# Patient Record
Sex: Male | Born: 1950 | Race: White | Hispanic: No | Marital: Married | Smoking: Former smoker
Health system: Southern US, Community
[De-identification: ages and names within clinical notes are randomized; demographics above are authoritative.]

## PROBLEM LIST (undated history)

## (undated) DIAGNOSIS — E7800 Pure hypercholesterolemia, unspecified: Secondary | ICD-10-CM

## (undated) DIAGNOSIS — I10 Essential (primary) hypertension: Secondary | ICD-10-CM

---

## 2015-11-26 LAB — BASIC METABOLIC PANEL
BUN: 21 (ref 4–21)
Creatinine: 1.3 (ref <=1.3)
POTASSIUM: 4.6 (ref 3.4–5.3)
SODIUM: 134 — AB (ref 137–147)

## 2015-11-26 LAB — LIPID PANEL
Cholesterol: 279 — AB (ref 0–200)
HDL: 38 (ref 35–70)
LDL Cholesterol: 214
TRIGLYCERIDES: 135 (ref 40–160)

## 2015-11-26 LAB — VITAMIN B12: Vitamin B-12: 516

## 2015-11-26 LAB — HEMOGLOBIN A1C: HEMOGLOBIN A1C: 5.9

## 2015-11-26 LAB — CBC AND DIFFERENTIAL
HEMATOCRIT: 47 (ref 41–53)
HEMOGLOBIN: 15.8 (ref 13.5–17.5)
Neutrophils Absolute: 8211
PLATELETS: 365 (ref 150–399)
WBC: 11.9

## 2015-11-26 LAB — VITAMIN D 25 HYDROXY (VIT D DEFICIENCY, FRACTURES): Vit D, 25-Hydroxy: 30

## 2015-11-26 LAB — PSA: PSA: 1.3

## 2015-11-26 LAB — HEPATIC FUNCTION PANEL
ALT: 27 (ref 10–40)
AST: 21 (ref 14–40)
Alkaline Phosphatase: 82 (ref 25–125)
BILIRUBIN, TOTAL: 0.5

## 2015-11-26 LAB — TSH: TSH: 1.48 (ref <=5.90)

## 2016-09-05 ENCOUNTER — Observation Stay (HOSPITAL_COMMUNITY)
Admission: EM | Admit: 2016-09-05 | Discharge: 2016-09-07 | Disposition: A | Discharge status: Home / Self Care | Payer: Medicare Other | Attending: Internal Medicine | Admitting: Internal Medicine

## 2016-09-05 ENCOUNTER — Encounter (HOSPITAL_COMMUNITY): Payer: Self-pay

## 2016-09-05 DIAGNOSIS — Z79899 Other long term (current) drug therapy: Secondary | ICD-10-CM | POA: Diagnosis not present

## 2016-09-05 DIAGNOSIS — D72829 Elevated white blood cell count, unspecified: Secondary | ICD-10-CM | POA: Insufficient documentation

## 2016-09-05 DIAGNOSIS — R059 Cough, unspecified: Secondary | ICD-10-CM

## 2016-09-05 DIAGNOSIS — I10 Essential (primary) hypertension: Secondary | ICD-10-CM

## 2016-09-05 DIAGNOSIS — R05 Cough: Secondary | ICD-10-CM

## 2016-09-05 DIAGNOSIS — R55 Syncope and collapse: Principal | ICD-10-CM | POA: Diagnosis present

## 2016-09-05 DIAGNOSIS — E871 Hypo-osmolality and hyponatremia: Secondary | ICD-10-CM | POA: Insufficient documentation

## 2016-09-05 DIAGNOSIS — I700 Atherosclerosis of aorta: Secondary | ICD-10-CM | POA: Insufficient documentation

## 2016-09-05 DIAGNOSIS — F17210 Nicotine dependence, cigarettes, uncomplicated: Secondary | ICD-10-CM | POA: Insufficient documentation

## 2016-09-05 DIAGNOSIS — R42 Dizziness and giddiness: Secondary | ICD-10-CM | POA: Insufficient documentation

## 2016-09-05 DIAGNOSIS — E785 Hyperlipidemia, unspecified: Secondary | ICD-10-CM | POA: Insufficient documentation

## 2016-09-05 HISTORY — DX: Pure hypercholesterolemia, unspecified: E78.00

## 2016-09-05 HISTORY — DX: Essential (primary) hypertension: I10

## 2016-09-05 LAB — CBC
HCT: 41.4 % (ref 39.0–52.0)
HEMOGLOBIN: 14.1 g/dL (ref 13.0–17.0)
MCH: 29.2 pg (ref 26.0–34.0)
MCHC: 34.1 g/dL (ref 30.0–36.0)
MCV: 85.7 fL (ref 78.0–100.0)
PLATELETS: 409 10*3/uL — AB (ref 150–400)
RBC: 4.83 MIL/uL (ref 4.22–5.81)
RDW: 13.1 % (ref 11.5–15.5)
WBC: 14.9 10*3/uL — AB (ref 4.0–10.5)

## 2016-09-05 LAB — URINALYSIS, ROUTINE W REFLEX MICROSCOPIC
BILIRUBIN URINE: NEGATIVE
GLUCOSE, UA: 150 mg/dL — AB
Hgb urine dipstick: NEGATIVE
KETONES UR: NEGATIVE mg/dL
LEUKOCYTES UA: NEGATIVE
Nitrite: NEGATIVE
PROTEIN: NEGATIVE mg/dL
SPECIFIC GRAVITY, URINE: 1.004 — AB (ref 1.005–1.030)
pH: 5 (ref 5.0–8.0)

## 2016-09-05 LAB — BASIC METABOLIC PANEL WITH GFR
Anion gap: 10 (ref 5–15)
BUN: 14 mg/dL (ref 6–20)
CO2: 23 mmol/L (ref 22–32)
Calcium: 9.2 mg/dL (ref 8.9–10.3)
Chloride: 97 mmol/L — ABNORMAL LOW (ref 101–111)
Creatinine, Ser: 1.46 mg/dL — ABNORMAL HIGH (ref 0.61–1.24)
GFR calc Af Amer: 56 mL/min — ABNORMAL LOW
GFR calc non Af Amer: 48 mL/min — ABNORMAL LOW
Glucose, Bld: 175 mg/dL — ABNORMAL HIGH (ref 65–99)
Potassium: 4 mmol/L (ref 3.5–5.1)
Sodium: 130 mmol/L — ABNORMAL LOW (ref 135–145)

## 2016-09-05 LAB — I-STAT TROPONIN, ED: Troponin i, poc: 0 ng/mL (ref 0.00–0.08)

## 2016-09-05 LAB — OSMOLALITY: Osmolality: 288 mOsm/kg (ref 275–295)

## 2016-09-05 MED ORDER — CLONIDINE HCL 0.2 MG PO TABS
0.2000 mg | ORAL_TABLET | Freq: Every day | ORAL | Status: DC
  Administered 2016-09-06 – 2016-09-07 (×2): 0.2 mg via ORAL
  Filled 2016-09-05 (×2): qty 1

## 2016-09-05 MED ORDER — SENNOSIDES-DOCUSATE SODIUM 8.6-50 MG PO TABS: 1.0000 | ORAL_TABLET | Freq: Every evening | ORAL | Status: DC | PRN

## 2016-09-05 MED ORDER — ENOXAPARIN SODIUM 40 MG/0.4ML ~~LOC~~ SOLN
40.0000 mg | SUBCUTANEOUS | Status: DC
  Administered 2016-09-05 – 2016-09-06 (×2): 40 mg via SUBCUTANEOUS
  Filled 2016-09-05 (×2): qty 0.4

## 2016-09-05 MED ORDER — ATORVASTATIN CALCIUM 10 MG PO TABS
10.0000 mg | ORAL_TABLET | Freq: Every day | ORAL | Status: DC
  Administered 2016-09-06: 10 mg via ORAL
  Filled 2016-09-05: qty 1

## 2016-09-05 NOTE — ED Notes (Signed)
Report attempted 

## 2016-09-05 NOTE — ED Triage Notes (Signed)
Per Pt, Pt is coming from home with complaints of sudden onset of diaphoresis and generalized weakness. Pt reports having a similar episode a week ago in [employer] where a paramedic checked him and reported heat exhaustion. Pt denies CP, SOB, unilateral weakness, aphasia, or confusion. Reports one episode of vomiting and one episode of diarrhea today along with blurred vision in the last hour after the onset of the symptoms.

## 2016-09-05 NOTE — ED Provider Notes (Signed)
[facility]-EMERGENCY DEPT Provider Note   CSN: [id number] Arrival date & time: 09/05/16  1507 PCP Dr Clarene DukeLittle ([facility])    History   Chief Complaint Chief Complaint  Patient presents with  . Weakness    HPI Purcell MoutonMichael A Eddington Sr. is a 66 y.o. male.  HPI Pt was sitting down talking at the table. when he suddenly started feel weak.  He felt cold but not sweaty.  He felt clammy and his vision seemed blurred.  He thought he might pass out.  This lasted for about 5 minutes. He felt nervous and anxious after that.  He did vomit once as well.  He is feeling better right now although when he tried to stand up he felt weak.  The weakness was all over, not just one side or the other. A couple of weeks ago he had a similar episode while shopping.  EMS checked him out, he did not seek further evaluation.  They told him it was heat exhaustion and to drink a lot of water.  He has been drinking 3 bottles of water per day.The patient currently does not feel as bad as he did earlier.  Past Medical History:  Diagnosis Date  . High cholesterol   . Hypertension     There are no active problems to display for this patient.   History reviewed. No pertinent surgical history.     Home Medications    Prior to Admission medications   Not on File    Family History No family history on file.  Social History Social History  Substance Use Topics  . Smoking status: Current Every Day Smoker    Packs/day: 0.25    Types: Cigarettes  . Smokeless tobacco: Never Used  . Alcohol use No     Allergies   Patient has no known allergies.   Review of Systems Review of Systems  All other systems reviewed and are negative.    Physical Exam Updated Vital Signs BP 130/65   Pulse 60   Temp 97.9 F (36.6 C) (Oral)   Resp 11   Ht 1.803 m (5\' 11" )   Wt 92.1 kg (203 lb)   SpO2 98%   BMI 28.31 kg/m   Physical Exam  Constitutional: No distress.  HENT:  Head: Normocephalic and  atraumatic.  Right Ear: External ear normal.  Left Ear: External ear normal.  Eyes: Conjunctivae are normal. Right eye exhibits no discharge. Left eye exhibits no discharge. No scleral icterus.  Neck: Neck supple. No tracheal deviation present.  Cardiovascular: Normal rate, regular rhythm and intact distal pulses.   Pulmonary/Chest: Effort normal and breath sounds normal. No stridor. No respiratory distress. He has no wheezes. He has no rales.  Abdominal: Soft. Bowel sounds are normal. He exhibits no distension. There is no tenderness. There is no rebound and no guarding.  Musculoskeletal: He exhibits no edema or tenderness.  Neurological: He is alert. He has normal strength. No cranial nerve deficit (no facial droop, extraocular movements intact, no slurred speech) or sensory deficit. He exhibits normal muscle tone. He displays no seizure activity. Coordination normal.  Skin: Skin is warm and dry. No rash noted.  Psychiatric: He has a normal mood and affect.  Nursing note and vitals reviewed.    ED Treatments / Results  Labs (all labs ordered are listed, but only abnormal results are displayed) Labs Reviewed  BASIC METABOLIC PANEL - Abnormal; Notable for the following:       Result Value  Sodium 130 (*)    Chloride 97 (*)    Glucose, Bld 175 (*)    Creatinine, Ser 1.46 (*)    GFR calc non Af Amer 48 (*)    GFR calc Af Amer 56 (*)    All other components within normal limits  CBC - Abnormal; Notable for the following:    WBC 14.9 (*)    Platelets 409 (*)    All other components within normal limits  URINALYSIS, ROUTINE W REFLEX MICROSCOPIC - Abnormal; Notable for the following:    Color, Urine STRAW (*)    Specific Gravity, Urine 1.004 (*)    Glucose, UA 150 (*)    All other components within normal limits  I-STAT TROPONIN, ED    EKG  EKG Interpretation  Date/Time:  Saturday September 05 2016 18:16:07 EDT Ventricular Rate:  65 PR Interval:  132 QRS Duration: 96 QT  Interval:  412 QTC Calculation: 429 R Axis:   79 Text Interpretation:  Sinus rhythm Borderline low voltage, extremity leads No significant change since last tracing Confirmed by Linwood DibblesKnapp, Jozsef Wescoat [phone]([id number]) on 09/05/2016 6:28:22 PM       Radiology No results found.  Procedures Procedures (including critical care time)  Medications Ordered in ED Medications - No data to display   Initial Impression / Assessment and Plan / ED Course  I have reviewed the triage vital signs and the nursing notes.  Pertinent labs & imaging results that were available during my care of the patient were reviewed by me and considered in my medical decision making (see chart for details).   patient presented to the emergency room for evaluation of a near syncopal episode. Patient has had 2 events known the last couple of weeks. Initially was attributed to heat exhaustion. Today however the patient was at rest sitting at a table or a suddenly felt like he was going to pass out. Patient's neurologic exam is normal today. He was not complaining of focal neurologic deficits think that TIA or stroke are less likely. I am concerned about the possibility of a cardiac dysrhythmia. He does have history of hypertension and hypercholesterolemia. Considering his age and risk factors and think it's reasonable to bring him in for overnight observation.  Patient is also hyponatremic. I think this is likely related to his increased water intake. I did point out that the patient should try limiting his free water intake.  Final Clinical Impressions(s) / ED Diagnoses   Final diagnoses:  Near syncope  Hyponatremia      Linwood DibblesKnapp, Trent Theisen, MD 09/05/16 [phone]

## 2016-09-05 NOTE — H&P (Signed)
Date: 09/06/2016               Patient Name:  William Hogan. MRN: [id number]  DOB: 12/19/1950 Age / Sex: 66 y.o., male   PCP: Aida PufferLittle, James, MD         Medical Service: Internal Medicine Teaching Service         Attending Physician: Dr. Inez CatalinaMullen, Emily B, MD    First Contact: Dr. Crista ElliotHarbrecht Pager: [phone]  Second Contact: Dr. Lenis NoonVasu Pager: [phone]       After Hours (After 5p/  First Contact Pager: [phone]  weekends / holidays): Second Contact Pager: [phone]   Chief Complaint: dizziness, presyncope  History of Present Illness: Pt with PMH of HTN, HLD, reports traveling to go see his brother today.  While visiting, seated, talking with his brother he began to feel dizzy, his vision blurred and felt like he was going to pass out.  After the episode he felt weak and had difficulty standing up.  He denies weakness on one side of the body and describes it as a generalized weakness. He denies any associated chest pain, SOB, nausea, vomiting. Pt reports experiencing a similar episode about 2 weeks ago while going grocery shopping.  Pt said he just arrived at the grocery store and got a cart and was heading to the first aisle when he began feeling dizzy, his vision blurred and he felt weak.  The paramedics were called and he was evaluated and given the choice to go to the hospital for further evaluation.  He declined, at the time he was drinking only pepsi and has since switched to drinking three 16oz bottles of water per day.  He had another similar episode 5 years ago when his doctor added a new blood pressure medication lisinopril.  In the Emergency Department,  Pt had CBC, BMP, troponin, urinalysis and ECG that were negative other than a mild hyponatremia.    Meds:  No outpatient prescriptions have been marked as taking for the 09/05/16 encounter [facility](Hospital Encounter).   lisinopril 20mg  Atorvastatin 10mg  Bisoprolol 25mg  Clonidine 0.2mg    Allergies: Allergies as of 09/05/2016  . (No  Known Allergies)   Past Medical History:  Diagnosis Date  . High cholesterol   . Hypertension     Family History:   Social History:  3ppd smoking history for 43 years, down to a few cigarettes every few days per pt Prior alcohol use 2-3 beers per day quit in 2012  Review of Systems: Review of Systems  Constitutional: Negative for chills, diaphoresis, fever and weight loss.  HENT: Positive for hearing loss. Negative for ear discharge and ear pain.   Eyes: Positive for blurred vision. Negative for double vision and photophobia.  Respiratory: Negative for cough, hemoptysis and sputum production.   Cardiovascular: Negative for chest pain, palpitations, orthopnea, claudication, leg swelling and PND.  Gastrointestinal: Negative for heartburn, nausea and vomiting.  Genitourinary: Negative for dysuria and urgency.  Musculoskeletal: Negative for back pain, myalgias and neck pain.  Skin: Negative for itching and rash.  Neurological: Positive for dizziness and weakness. Negative for sensory change, speech change, focal weakness, seizures, loss of consciousness and headaches.  Psychiatric/Behavioral: Negative for depression and suicidal ideas.     Physical Exam: Blood pressure (!) 148/76, pulse 64, temperature 98.6 F (37 C), resp. rate 16, height 5\' 11"  (1.803 m), weight 198 lb 11.2 oz (90.1 kg), SpO2 100 %. Physical Exam  Constitutional: He is oriented to person, place, and  time. He appears well-developed and well-nourished.  HENT:  Head: Normocephalic and atraumatic.  Eyes: Pupils are equal, round, and reactive to light. EOM are normal. Right eye exhibits no discharge. Left eye exhibits no discharge. No scleral icterus.  Neck: Normal range of motion. Neck supple. No JVD present.  Cardiovascular: Normal rate, regular rhythm and intact distal pulses.   Heart sounds distant difficult to auscultate  Pulmonary/Chest: Effort normal and breath sounds normal. No respiratory distress. He has  no wheezes.  Abdominal: Soft. Bowel sounds are normal. He exhibits no distension. There is no tenderness. There is no guarding.  Neurological: He is alert and oriented to person, place, and time. He displays normal reflexes. No cranial nerve deficit or sensory deficit. He exhibits normal muscle tone. Coordination normal.  Skin: Skin is warm and dry. Capillary refill takes 2 to 3 seconds.  Psychiatric: He has a normal mood and affect. His behavior is normal.    EKG: personally reviewed my interpretation is sinus rhythm, normal axis, no st changes, normal intervals  CXR: personally reviewed my interpretation is   Assessment & Plan by Problem: Principal Problem:   Near syncope Active Problems:   Hyponatremia   Essential hypertension   Dyspnea on exertion  Near Syncope: episode occurred while pt at rest, similar episode two weeks ago in grocery store:  DDX:  Cardiac: either arrhythmia, primary mechanical dysfunction or 2/2 ischemia, pt has extensive smoking history, HTN, HLD Neurocardiogenic: pt has history of prior episodes that felt similar, pt has had a cough for the last month and had a coughing fit while we were present, pt likely has diabetes although claims to have been borderline at his last office visit Neurologic: pt likely has extensive vascular disease, could have resulted in TIA, vertebrobasil insufficiency  -Telemetry -orthostatic vital signs -ECHO -Repeat Troponin  Hyponatremia: unknown etiology -urine osmolality, urine sodium ordered   HTN: pt on clonidine and lisinopril at home -restarted home dose clonidine 0.2mg   Dyspnea on exertion: pt admits to shortness of breath when trying to mow the lawn, and will have to take frequent breaks, denies any chest pain -Monitor pt for signs of increasing SOB     Dispo: Admit patient to Observation with expected length of stay less than 2 midnights.  Signed: Angelita InglesWinfrey, Bryceton Hantz B, MD 09/06/2016, 12:52 AM  Pager: [phone]

## 2016-09-06 ENCOUNTER — Encounter (HOSPITAL_COMMUNITY): Payer: Self-pay | Admitting: *Deleted

## 2016-09-06 ENCOUNTER — Observation Stay (HOSPITAL_BASED_OUTPATIENT_CLINIC_OR_DEPARTMENT_OTHER): Payer: Medicare Other

## 2016-09-06 ENCOUNTER — Telehealth: Payer: Self-pay | Admitting: Internal Medicine

## 2016-09-06 ENCOUNTER — Observation Stay (HOSPITAL_COMMUNITY): Payer: Medicare Other

## 2016-09-06 DIAGNOSIS — R0609 Other forms of dyspnea: Secondary | ICD-10-CM | POA: Insufficient documentation

## 2016-09-06 DIAGNOSIS — I340 Nonrheumatic mitral (valve) insufficiency: Secondary | ICD-10-CM | POA: Diagnosis not present

## 2016-09-06 DIAGNOSIS — E871 Hypo-osmolality and hyponatremia: Secondary | ICD-10-CM | POA: Insufficient documentation

## 2016-09-06 DIAGNOSIS — I10 Essential (primary) hypertension: Secondary | ICD-10-CM

## 2016-09-06 DIAGNOSIS — R55 Syncope and collapse: Secondary | ICD-10-CM | POA: Diagnosis not present

## 2016-09-06 LAB — BASIC METABOLIC PANEL
Anion gap: 8 (ref 5–15)
BUN: 11 mg/dL (ref 6–20)
CALCIUM: 9.4 mg/dL (ref 8.9–10.3)
CO2: 26 mmol/L (ref 22–32)
CREATININE: 1.22 mg/dL (ref 0.61–1.24)
Chloride: 104 mmol/L (ref 101–111)
GFR calc non Af Amer: >60 mL/min (ref >60)
Glucose, Bld: 111 mg/dL — ABNORMAL HIGH (ref 65–99)
Potassium: 4.6 mmol/L (ref 3.5–5.1)
Sodium: 138 mmol/L (ref 135–145)

## 2016-09-06 LAB — CBC
HCT: 41.6 % (ref 39.0–52.0)
HEMOGLOBIN: 14 g/dL (ref 13.0–17.0)
MCH: 28.6 pg (ref 26.0–34.0)
MCHC: 33.7 g/dL (ref 30.0–36.0)
MCV: 85.1 fL (ref 78.0–100.0)
Platelets: 401 10*3/uL — ABNORMAL HIGH (ref 150–400)
RBC: 4.89 MIL/uL (ref 4.22–5.81)
RDW: 13.1 % (ref 11.5–15.5)
WBC: 14.2 10*3/uL — ABNORMAL HIGH (ref 4.0–10.5)

## 2016-09-06 LAB — CBC WITH DIFFERENTIAL/PLATELET
Basophils Absolute: 0 10*3/uL (ref 0.0–0.1)
Basophils Relative: 0 %
EOS PCT: 2 %
Eosinophils Absolute: 0.3 10*3/uL (ref 0.0–0.7)
HEMATOCRIT: 41.5 % (ref 39.0–52.0)
Hemoglobin: 14.1 g/dL (ref 13.0–17.0)
LYMPHS ABS: 2.7 10*3/uL (ref 0.7–4.0)
LYMPHS PCT: 21 %
MCH: 28.9 pg (ref 26.0–34.0)
MCHC: 34 g/dL (ref 30.0–36.0)
MCV: 85 fL (ref 78.0–100.0)
MONO ABS: 0.9 10*3/uL (ref 0.1–1.0)
MONOS PCT: 7 %
Neutro Abs: 9 10*3/uL — ABNORMAL HIGH (ref 1.7–7.7)
Neutrophils Relative %: 70 %
Platelets: 396 10*3/uL (ref 150–400)
RBC: 4.88 MIL/uL (ref 4.22–5.81)
RDW: 13.1 % (ref 11.5–15.5)
WBC: 12.9 10*3/uL — ABNORMAL HIGH (ref 4.0–10.5)

## 2016-09-06 LAB — ECHOCARDIOGRAM COMPLETE
HEIGHTINCHES: 71 in
WEIGHTICAEL: 3179.2 [oz_av]

## 2016-09-06 LAB — SODIUM, URINE, RANDOM: Sodium, Ur: 40 mmol/L

## 2016-09-06 LAB — OSMOLALITY, URINE: Osmolality, Ur: 162 mOsm/kg — ABNORMAL LOW (ref 300–900)

## 2016-09-06 MED ORDER — BISOPROLOL FUMARATE 10 MG PO TABS
10.0000 mg | ORAL_TABLET | Freq: Every day | ORAL | Status: DC
  Administered 2016-09-06 – 2016-09-07 (×2): 10 mg via ORAL
  Filled 2016-09-06 (×2): qty 1

## 2016-09-06 MED ORDER — LISINOPRIL 20 MG PO TABS
20.0000 mg | ORAL_TABLET | Freq: Every day | ORAL | Status: DC
  Filled 2016-09-06: qty 1

## 2016-09-06 MED ORDER — SODIUM CHLORIDE 0.9 % IV SOLN
INTRAVENOUS | Status: DC
  Administered 2016-09-06: 02:00:00 via INTRAVENOUS

## 2016-09-06 MED ORDER — LISINOPRIL 20 MG PO TABS
20.0000 mg | ORAL_TABLET | Freq: Every day | ORAL | Status: DC
  Administered 2016-09-06: 20 mg via ORAL

## 2016-09-06 NOTE — Progress Notes (Signed)
  2D Echocardiogram has been performed.  William Hogan T Damonica Chopra 09/06/2016, 2:03 PM

## 2016-09-06 NOTE — Progress Notes (Signed)
History              Chief Complaint    Chief Complaint  Patient presents with  . Weakness    HPI Purcell MoutonMichael A Encinas Sr. is a 66 y.o. male.  HPI Pt was sitting down talking at the table. when he suddenly started feel weak.  He felt cold but not sweaty.  He felt clammy and his vision seemed blurred.  He thought he might pass out.  This lasted for about 5 minutes. He felt nervous and anxious after that.  He did vomit once as well.  He is feeling better right now although when he tried to stand up he felt weak.  The weakness was all over, not just one side or the other. A couple of weeks ago he had a similar episode while shopping.  EMS checked him out, he did not seek further evaluation.  They told him it was heat exhaustion and to drink a lot of water.  He has been drinking 3 bottles of water per day.The patient currently does not feel as bad as he did earlier.      Past Medical History:  Diagnosis Date  . High cholesterol   . Hypertension     There are no active problems to display for this patient.   History reviewed. No pertinent surgical history. Patient arrived via stretcher from the ED, alert and oriented x 4.  IV in LAC, saline locked.  Placed on telemetry box 6 and continuous pulse ox.  Skin intact except for abrasion on right lower leg (old) and a scab on the right great toe, ota.  Oriented to floor and given call bell with instructions.  Will continue to monitor.  Macarthur CritchleyMarie Jalesia Loudenslager, RN

## 2016-09-06 NOTE — Progress Notes (Addendum)
   Subjective: [name] was sitting up in his bed talking to his brother upon entering the room today. He stated that he felt well, had not experienced another events since the day prior and was feeling good overall. He denied concerns but questioned the cause of the events. He added that he had issues with taking his medications all at the same time in the morning prior to events he described as being in a fog and unable to think or recall where he was after arriving at work. His doctor told him to take all of them in the evening late for prevent this which he had been doing. His most recent episode occurred while sitting talking to his brother while the former episode occurred while he was walking through a grocery store. He was advised that we would continue to evaluate him but that his most likely source was secondary to mediation induced bradycardia. He denied nausea, vomiting, diarrhea, constipation, abdominal pain chest pain, muscle aches, urinary frequency, visual changes (except with the episodes), or dizziness. He denied loss of bowel or bladder control, tremor or shaking as well as loss of consciousness during the episodes. Of note, his BP was taken at returned within normal range after each episode where he is typically hypertensive.   Objective:  Vital signs in last 24 hours: Vitals:   09/05/16 2127 09/06/16 0443 09/06/16 0929 09/06/16 0929  BP: (!) 148/76 (!) 124/98 (!) 167/53 (!) 167/59  Pulse: 64 (!) 52  (!) 59  Resp: 16 20    Temp: 98.6 F (37 C) 97.8 F (36.6 C)  98.1 F (36.7 C)  TempSrc:    Oral  SpO2: 100% 96%  98%  Weight: 198 lb 11.2 oz (90.1 kg)     Height: 5\' 11"  (1.803 m)      Complete ROS negative except as per subjective.  Physical Exam  Constitutional: He appears well-developed and well-nourished. No distress.  HENT:  Head: Normocephalic and atraumatic.  Neck: Normal range of motion. Neck supple. No JVD present.  Cardiovascular: Normal rate and regular  rhythm.   No murmur heard. Pulmonary/Chest: Effort normal and breath sounds normal. No stridor. No respiratory distress. He has no wheezes.  Abdominal: Soft. Bowel sounds are normal. He exhibits no distension. There is no tenderness.  Musculoskeletal: Normal range of motion. He exhibits no edema or tenderness.  Neurological: He is alert.  Skin: Skin is warm and dry. He is not diaphoretic.  Psychiatric: He has a normal mood and affect. Judgment and thought content normal.    Assessment/Plan:  Principal Problem:   Near syncope Active Problems:   Hyponatremia   Essential hypertension   Dyspnea on exertion  Near syncope, dyspnea on exertion: Negative orthostatics, tele unremarkable for arrhythmia, showing persistent bradycardia -Held bisoprolol initially, will continue at half dose to decrease rate control medications due to bradycardia -Continued it as half his home dose, now 10mg  daily -continued clonidine 0.2mg  daily -lisinopril 40mg  continued -Echo ordered with results pending  Hyponatremia:  resolved  HTN: -Continued it as half his home dose, now 10mg  daily -continued clonidine 0.2mg  daily -lisinopril 40mg  continued  Diet: Regular Code: Full Fluids: none DVT ppx: lovenox 40mg  daily Dispo: Anticipated discharge in approximately 1-2 day(s).   Lanelle BalHarbrecht, Breydan [place]illing[place], MD 09/06/2016, 2:58 PM Pager: Pager# [phone]

## 2016-09-06 NOTE — Evaluation (Signed)
Physical Therapy Evaluation Patient Details Name: William MoutonMichael A Duggan Sr. MRN: [id number] DOB: 1950/12/03 Today's Date: 09/06/2016   History of Present Illness  Pt is a 66 yo male admitted through ED on 09/05/16 following an episode of dizziness, blurred vision and syncopal symptoms. Pt had a similar episode x 2 weeks. Diagnosis still pending. Questioning arrythmia vs cardiac ischemia. Pt was found to have hyponatremia and DOE. PMH significant for HTN.   Clinical Impression  Pt presents with the above diagnosis and below deficits for therapy evaluation. Prior to admission, pt was living with his wife for whom he is the primary caregiver. Pt's wife had a stroke in 2013 and he does all household chores and errands. Pt was completely independent prior to admission. Pt requires Min guard to supervision for all mobility this session with a right drift during gait having to grab on the rail to steady himself. Recommended the possibility of using a [place] for stability once discharged. PT will return next session to re-check mobility with and without [place] prior to making final recommendation. Pt will benefit from continued acute PT services in order to address the below deficits prior to D/C.    Follow Up Recommendations No PT follow up    Equipment Recommendations  Cane    Recommendations for Other Services       Precautions / Restrictions Precautions Precautions: Fall Restrictions Weight Bearing Restrictions: No      Mobility  Bed Mobility Overal bed mobility: Modified Independent             General bed mobility comments: able to get into and out of bed without any assistance just increased time  Transfers Overall transfer level: Needs assistance Equipment used: None Transfers: Sit to/from Stand Sit to Stand: Supervision         General transfer comment: supervision for safety, no physical assistance  Ambulation/Gait Ambulation/Gait assistance: Supervision;Min guard Ambulation  Distance (Feet): 120 Feet Assistive device: None Gait Pattern/deviations: Step-through pattern;Decreased stride length;Drifts right/left Gait velocity: decreased Gait velocity interpretation: Below normal speed for age/gender General Gait Details: Minimal drifting to right and grasping for railing occasionally. Increased fatigue noted and unsteadiness with gait.   Stairs            Wheelchair Mobility    Modified Rankin (Stroke Patients Only)       Balance Overall balance assessment: Needs assistance Sitting-balance support: No upper extremity supported;Feet supported Sitting balance-Leahy Scale: Normal     Standing balance support: No upper extremity supported;During functional activity Standing balance-Leahy Scale: Fair Standing balance comment: able to stand statically and dynamically, but some unsteadiness with self recovery                             Pertinent Vitals/Pain Pain Assessment: No/denies pain    Home Living Family/patient expects to be discharged to:: Private residence Living Arrangements: Spouse/significant other Available Help at Discharge: Family;Available PRN/intermittently;Other (Comment) (pt is caregiver for wife) Type of Home: Mobile home Home Access: Stairs to enter Entrance Stairs-Rails: Right;Left;Can reach both Entrance Stairs-Number of Steps: 3 Home Layout: One level Home Equipment: None      Prior Function Level of Independence: Independent         Comments: completely independent prior to admission, driving and doing for himself.      Hand Dominance   Dominant Hand: Right    Extremity/Trunk Assessment   Upper Extremity Assessment Upper Extremity Assessment: Overall WFL for tasks  assessed    Lower Extremity Assessment Lower Extremity Assessment: Overall WFL for tasks assessed    Cervical / Trunk Assessment Cervical / Trunk Assessment: Normal  Communication   Communication: No difficulties  Cognition  Arousal/Alertness: Awake/alert Behavior During Therapy: WFL for tasks assessed/performed Overall Cognitive Status: Within Functional Limits for tasks assessed                                        General Comments General comments (skin integrity, edema, etc.): 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 Pt quit work to care for her. Reports that he goes to the grocery store, does all meal prep, and assist with caring for his wife.     Exercises     Assessment/Plan    PT Assessment Patient needs continued PT services  PT Problem List Decreased activity tolerance;Decreased balance;Decreased mobility;Decreased knowledge of use of DME       PT Treatment Interventions DME instruction;Gait training;Stair training;Functional mobility training;Therapeutic activities;Therapeutic exercise;Balance training    PT Goals (Current goals can be found in the Care Plan section)  Acute Rehab PT Goals Patient Stated Goal: to feel better and get back home PT Goal Formulation: With patient Time For Goal Achievement: 09/13/16 Potential to Achieve Goals: Good    Frequency Min 3X/week   Barriers to discharge        Co-evaluation               AM-PAC PT "6 Clicks" Daily Activity  Outcome Measure Difficulty turning over in bed (including adjusting bedclothes, sheets and blankets)?: None Difficulty moving from lying on back to sitting on the side of the bed? : None Difficulty sitting down on and standing up from a chair with arms (e.g., wheelchair, bedside commode, etc,.)?: A Little Help needed moving to and from a bed to chair (including a wheelchair)?: A Little Help needed walking in hospital room?: A Little Help needed climbing 3-5 steps with a railing? : A Little 6 Click Score: 20    End of Session Equipment Utilized During Treatment: Gait belt Activity Tolerance: Patient tolerated treatment well Patient left: in bed;with call bell/phone within  reach Nurse Communication: Mobility status PT Visit Diagnosis: Unsteadiness on feet (R26.81);Difficulty in walking, not elsewhere classified (R26.2)    Time: [id number] PT Time Calculation (min) (ACUTE ONLY): 16 min   Charges:   PT Evaluation $PT Eval Low Complexity: 1 Procedure     PT G Codes:   PT G-Codes **NOT FOR INPATIENT CLASS** Functional Assessment Tool Used: AM-PAC 6 Clicks Basic Mobility;Clinical judgement Functional Limitation: Mobility: Walking and moving around Mobility: Walking and Moving Around Current Status ([id number]([id number]): At least 20 percent but less than 40 percent impaired, limited or restricted Mobility: Walking and Moving Around Goal Status [phone]([id number]): 0 percent impaired, limited or restricted    Colin BroachSabra M. Kassidee Narciso PT, DPT  [phone]   William Hogan 09/06/2016, 3:23 PM

## 2016-09-06 NOTE — Progress Notes (Signed)
  2D Echocardiogram has been performed.  William Hogan T Ladine Kiper 09/06/2016, 2:02 PM

## 2016-09-07 DIAGNOSIS — R911 Solitary pulmonary nodule: Secondary | ICD-10-CM | POA: Diagnosis not present

## 2016-09-07 DIAGNOSIS — R55 Syncope and collapse: Secondary | ICD-10-CM

## 2016-09-07 DIAGNOSIS — D72829 Elevated white blood cell count, unspecified: Secondary | ICD-10-CM

## 2016-09-07 DIAGNOSIS — Z79899 Other long term (current) drug therapy: Secondary | ICD-10-CM

## 2016-09-07 DIAGNOSIS — I10 Essential (primary) hypertension: Secondary | ICD-10-CM | POA: Diagnosis not present

## 2016-09-07 LAB — CBC
HEMATOCRIT: 40.5 % (ref 39.0–52.0)
Hemoglobin: 13.7 g/dL (ref 13.0–17.0)
MCH: 28.8 pg (ref 26.0–34.0)
MCHC: 33.8 g/dL (ref 30.0–36.0)
MCV: 85.1 fL (ref 78.0–100.0)
PLATELETS: 389 10*3/uL (ref 150–400)
RBC: 4.76 MIL/uL (ref 4.22–5.81)
RDW: 13.1 % (ref 11.5–15.5)
WBC: 13.3 10*3/uL — ABNORMAL HIGH (ref 4.0–10.5)

## 2016-09-07 MED ORDER — HYDROCHLOROTHIAZIDE 12.5 MG PO CAPS: 12.5000 mg | ORAL_CAPSULE | Freq: Every day | ORAL | 2 refills | Status: DC

## 2016-09-07 MED ORDER — LISINOPRIL 40 MG PO TABS: 40.0000 mg | ORAL_TABLET | Freq: Every day | ORAL | 1 refills | Status: DC

## 2016-09-07 MED ORDER — LISINOPRIL 40 MG PO TABS: 40.0000 mg | ORAL_TABLET | Freq: Every day | ORAL | Status: DC

## 2016-09-07 MED ORDER — LISINOPRIL 20 MG PO TABS: 20.0000 mg | ORAL_TABLET | Freq: Every day | ORAL | Status: DC

## 2016-09-07 MED ORDER — HYDROCHLOROTHIAZIDE 12.5 MG PO CAPS: 12.5000 mg | ORAL_CAPSULE | Freq: Every day | ORAL | Status: DC

## 2016-09-07 MED ORDER — LISINOPRIL 20 MG PO TABS: 20.0000 mg | ORAL_TABLET | Freq: Every day | ORAL | 1 refills | Status: DC

## 2016-09-07 MED ORDER — HYDROCHLOROTHIAZIDE 10 MG/ML ORAL SUSPENSION
6.2500 mg | Freq: Every day | ORAL | Status: DC
  Filled 2016-09-07: qty 1.25

## 2016-09-07 NOTE — Discharge Instructions (Signed)
Please call and schedule an appointment for a hospital follow-up with your primary care doctor(PCP). At that visit, please discuss your episodes of pre-syncope and the medication changes that we introduced here at the hospital. It is our recommendation that you begin taking a low dose of a beta-blocker that has been shown to benefit individuals with heart failure if tolerated by your heart rate. The goal is to keep your heart rate near 60, which was without the bisoprolol.   The echocardiogram performed in the hospital indicated a slightly reduced ejection fraction of 50-55%, something that you should discuss with you PCP as well but not something to be alarmed about.   In addition, we have increased your lisinopril to 40mg  daily which should provide better blood pressure control without decreasing your heart rate. Your heart rate was in the upper 40 to lower 50's during your stay.

## 2016-09-07 NOTE — Care Management Obs Status (Signed)
MEDICARE OBSERVATION STATUS NOTIFICATION   Patient Details  Name: Purcell MoutonMichael A Jividen Sr. MRN: [id number] Date of Birth: 1950/08/06   Medicare Observation Status Notification Given:  Yes    Epifanio LeschesCole, Eduard Penkala Hudson, RN 09/07/2016, 10:57 AM

## 2016-09-07 NOTE — Progress Notes (Signed)
Internal Medicine Attending:   I saw and examined the patient. I reviewed the resident's note and I agree with the resident's findings and plan as documented in the resident's note.  Patient feels well today with no new complaints. He was initially admitted for near-syncope and was found to be bradycardic likely secondary to his beta blocker as well as possibly his clonidine. I will discontinue his beta blocker for now and continue with his clonidine to prevent rebound hypertension. I will outpatient follow-up with his PCP and defer to PCP as to whether he wants to taper his clonidine off and add another antihypertensive which does not cause bradycardia. Continue lisinopril and HCTZ for now. 2-D echo results noted. No further workup for now. Patient stable for discharge home today.

## 2016-09-07 NOTE — Progress Notes (Signed)
   Subjective: [name] was sitting comfortably in his bed this morning upon entering the room. He was curious as to when he would be discharged. He agreed to the plan that if all went well he would probably be discharged home today pending final evaluation of his telemetry with close follow-up as an outpatient with his primary care doctor. He denied chest pain, palpitations, recurrence of his lightheadedness, abdominal pain, nausea, vomiting, diarrhea, constipation or difficulty ambulating.   Objective:  Vital signs in last 24 hours: Vitals:   09/06/16 0929 09/06/16 0929 09/06/16 2149 09/07/16 0500  BP: (!) 167/53 (!) 167/59 (!) 158/68 (!) 148/64  Pulse:  (!) 59 (!) 55 (!) 56  Resp:   20 16  Temp:  98.1 F (36.7 C) 98.2 F (36.8 C)   TempSrc:  Oral  Oral  SpO2:  98% 98%   Weight:      Height:       Complete ROS negative except as per subjective.  Physical Exam  Constitutional: He is oriented to person, place, and time. He appears well-developed and well-nourished. No distress.  HENT:  Head: Normocephalic and atraumatic.  Eyes: EOM are normal. No scleral icterus.  Neck: Normal range of motion. Neck supple.  Cardiovascular: Regular rhythm.  Bradycardia present.  Exam reveals distant heart sounds.   Pulmonary/Chest: Effort normal and breath sounds normal. No respiratory distress. He has no wheezes.  Abdominal: Soft. Bowel sounds are normal. He exhibits no distension. There is no tenderness.  Musculoskeletal: Normal range of motion. He exhibits no edema or tenderness.  Neurological: He is alert and oriented to person, place, and time.  Skin: Skin is warm and dry. Capillary refill takes less than 2 seconds. He is not diaphoretic.  Psychiatric: He has a normal mood and affect.   Assessment/Plan:  Principal Problem:   Near syncope Active Problems:   Hyponatremia   Essential hypertension   Dyspnea on exertion  Near syncope, dyspnea on exertion: Negative orthostatics, tele  unremarkable for arrhythmia, showing persistent bradycardia -Held bisoprolol initially, continued it as half his home dose, now 10mg  daily -continued clonidine 0.2mg  daily -lisinopril 40mg  continued -Echo demonstrated 50-55% EF, will have patient follow-up as outpatient -Patient free of events since admission -Will check orthostatics again today -plan to discharge home with 1wk PCP follow-up  HTN: -Discontinued bisoprolol -continued clonidine 0.2mg  daily -lisinopril 20mg  continued -added HCTZ 12.5 as twice his home dose  Diet: Regular Code: Full Fluids: none DVT ppx: lovenox 40mg  daily Dispo: Anticipated discharge in approximately 0 day(s).   Lanelle BalHarbrecht, Stephenie Navejas, MD 09/07/2016, 8:37 AM Pager: Pager# [phone]

## 2016-09-07 NOTE — Discharge Summary (Signed)
Name: William MoutonMichael A Mannina Sr. MRN: [id number] DOB: 26-Oct-1950 66 y.o. PCP: Aida PufferLittle, James, MD  Date of Admission: 09/05/2016  5:33 PM Date of Discharge: 09/07/2016 Attending Physician: Earl LagosNischal Narendra, MD  Discharge Diagnosis: Principal Problem:   Near syncope Active Problems:   Essential hypertension   Discharge Medications: Allergies as of 09/07/2016   No Known Allergies     Medication List    STOP taking these medications   bisoprolol-hydrochlorothiazide 10-6.25 MG tablet Commonly known as:  ZIAC     TAKE these medications   atorvastatin 10 MG tablet Commonly known as:  LIPITOR Take 1 tablet by mouth daily.   cloNIDine 0.2 MG tablet Commonly known as:  CATAPRES Take 0.2 mg by mouth daily.   hydrochlorothiazide 12.5 MG capsule Commonly known as:  MICROZIDE Take 1 capsule (12.5 mg total) by mouth daily.   lisinopril 20 MG tablet Commonly known as:  PRINIVIL,ZESTRIL Take 1 tablet (20 mg total) by mouth at bedtime. What changed:  when to take this            Durable Medical Equipment        Start     Ordered   09/06/16 1704  For home use only DME Cane  Once     09/06/16 1703      Disposition and follow-up:   William A Baron Hamperrotter Sr. was [facility] in Good condition.  At the hospital follow up visit please address:  1.  Pre-syncope, hypertension, leukocytosis, , pulmonary nodule, medications induced bradycardia  2.  Labs / imaging needed at time of follow-up: CBC with differential, CT-Chest for 13mm pulmonary nodule  3.  Pending labs/ test needing follow-up: n/a  Follow-up Appointments: Follow-up Information    Little, James, MD Follow up in 1 week(s).   Specialty:  Family Medicine Contact information: [address] [place] [id number] [phone]           Hospital Course by problem list: Principal Problem:   Near syncope Active Problems:   Essential hypertension   Pre-syncopal event: William Hogan  is a pleasant 66 year old male who [facility] with complaints of near syncopal events. He has a past medical history significant for hypertension, hyperlipidemia, and tobacco use. He stated that he was feeling fine eating a BBQ sandwich while talking to his brother when he became lightheaded, diaphoretic, developed blurry vision, with mild anxious shaking. He denied loss of consciousness, being unable to maintain himself in his chair, denied shortness of breath, chest pain, heart palpitations, seizure like activity, loss of bowel control or muscle weakness. He had a similar episode at his grocery store two weeks prior wherein he was walking with his cart when the symptoms began. He was able to sit on the floor and awaited EMS there. They recommended visiting the ED but as he felt fine by then he went on with his day. He states that in the past he has had similar issues when he would take his medications in the early morning at the same time. By 1-2 hours later, he was in a mental "fog", and unable to clearly think or recall his situation. He states that his doctor told him to take the medication at a later time in the evening such that he would no longer experience these issues.  He was placed on continuous cardiac telemetry and admitted to the floor. Orthostatics were borderline with sitting 140/68 pulse of 65, standing at zero minutes  being 121/76 pulse 75 and after three minutes 134/71 pulse 79. Cardiac monitoring demonstrated persistent bradycardia but no arrhythmia prompting discontinuation of his beta blocker. Due to HTN concerns, he was placed back on his lisinopril 20mg  daily while doubling his HCTZ to 12.5 daily. Further evaluation of his pre-syncopal events is warranted given the limitations of 48 hours of cardiac monitoring.  Cardiac arrhythmia can not be ruled out given his presentation of sitting while encountering the events, cardiac arrhythmias could not be definitively ruled  out. We would recommend outpatient long term cardiac monitoring to rule out an arrhythmia if symptoms persist.   HTN: Patient remained mildly elevated during his stay. Discontinued his bisoprolol due to bradycardia and increased his HCTZ to 12.5 while leaving his lisinopril at 20mg  daily.   Leukocytosis: -Incidental elevated WBC without fever, chills, supporting physical exam findings of underlying infection. Chest S-ray demonstrated a 13mm upper lobe nodule with recommended CT for follow-up but no infectious process noted. Urinalysis was unremarkable. No source of infection was identified. Patient advised to follow-up with his PCP.  Discharge Vitals:   BP (!) 150/78   Pulse (!) 56   Temp 98.2 F (36.8 C)   Resp 16   Ht 5\' 11"  (1.803 m)   Wt 198 lb 11.2 oz (90.1 kg)   SpO2 98%   BMI 27.71 kg/m   Pertinent Labs, Studies, and Procedures:  CMP Latest Ref Rng & Units 09/06/2016 09/05/2016  Glucose 65 - 99 mg/dL 161(W111(H) 960(A175(H)  BUN 6 - 20 mg/dL 11 14  Creatinine 5.400.61 - 1.24 mg/dL 9.811.22 1.91(Y1.46(H)  Sodium 782135 - 145 mmol/L 138 130(L)  Potassium 3.5 - 5.1 mmol/L 4.6 4.0  Chloride 101 - 111 mmol/L 104 97(L)  CO2 22 - 32 mmol/L 26 23  Calcium 8.9 - 10.3 mg/dL 9.4 9.2    CBC Latest Ref Rng & Units 09/07/2016 09/06/2016 09/06/2016  WBC 4.0 - 10.5 K/uL 13.3(H) 12.9(H) 14.2(H)  Hemoglobin 13.0 - 17.0 g/dL 95.613.7 21.314.1 08.614.0  Hematocrit 39.0 - 52.0 % 40.5 41.5 41.6  Platelets 150 - 400 K/uL 389 396 401(H)    Discharge Instructions: Discharge Instructions    Call MD for:  persistant dizziness or light-headedness    Complete by:  As directed    Diet - low sodium heart healthy    Complete by:  As directed    Increase activity slowly    Complete by:  As directed       Signed: Lanelle BalHarbrecht, Areona Homer, MD 09/07/2016, 3:45 PM   Pager: Pager# [phone]

## 2016-09-18 NOTE — Telephone Encounter (Signed)
This encounter was created in error

## 2016-12-03 ENCOUNTER — Other Ambulatory Visit: Payer: Self-pay | Admitting: Internal Medicine

## 2017-03-12 ENCOUNTER — Emergency Department (HOSPITAL_COMMUNITY)
Admission: EM | Admit: 2017-03-12 | Discharge: 2017-03-12 | Disposition: A | Discharge status: Home / Self Care | Payer: Medicare Other | Attending: Emergency Medicine | Admitting: Emergency Medicine

## 2017-03-12 ENCOUNTER — Encounter (HOSPITAL_COMMUNITY): Payer: Self-pay | Admitting: Emergency Medicine

## 2017-03-12 ENCOUNTER — Emergency Department (HOSPITAL_COMMUNITY): Payer: Medicare Other

## 2017-03-12 ENCOUNTER — Other Ambulatory Visit: Payer: Self-pay

## 2017-03-12 DIAGNOSIS — R42 Dizziness and giddiness: Secondary | ICD-10-CM | POA: Insufficient documentation

## 2017-03-12 DIAGNOSIS — R51 Headache: Secondary | ICD-10-CM | POA: Insufficient documentation

## 2017-03-12 DIAGNOSIS — Z79899 Other long term (current) drug therapy: Secondary | ICD-10-CM | POA: Diagnosis not present

## 2017-03-12 DIAGNOSIS — R911 Solitary pulmonary nodule: Secondary | ICD-10-CM

## 2017-03-12 DIAGNOSIS — F17210 Nicotine dependence, cigarettes, uncomplicated: Secondary | ICD-10-CM | POA: Insufficient documentation

## 2017-03-12 DIAGNOSIS — R112 Nausea with vomiting, unspecified: Secondary | ICD-10-CM | POA: Diagnosis not present

## 2017-03-12 DIAGNOSIS — R05 Cough: Secondary | ICD-10-CM | POA: Insufficient documentation

## 2017-03-12 DIAGNOSIS — I10 Essential (primary) hypertension: Secondary | ICD-10-CM | POA: Diagnosis not present

## 2017-03-12 LAB — CBC WITH DIFFERENTIAL/PLATELET
BASOS PCT: 0 %
Basophils Absolute: 0 10*3/uL (ref 0.0–0.1)
EOS ABS: 0.1 10*3/uL (ref 0.0–0.7)
Eosinophils Relative: 0 %
HEMATOCRIT: 46.5 % (ref 39.0–52.0)
HEMOGLOBIN: 15.6 g/dL (ref 13.0–17.0)
Lymphocytes Relative: 12 %
Lymphs Abs: 1.6 10*3/uL (ref 0.7–4.0)
MCH: 29.4 pg (ref 26.0–34.0)
MCHC: 33.5 g/dL (ref 30.0–36.0)
MCV: 87.7 fL (ref 78.0–100.0)
MONO ABS: 0.6 10*3/uL (ref 0.1–1.0)
MONOS PCT: 4 %
NEUTROS ABS: 11.2 10*3/uL — AB (ref 1.7–7.7)
NEUTROS PCT: 84 %
Platelets: 393 10*3/uL (ref 150–400)
RBC: 5.3 MIL/uL (ref 4.22–5.81)
RDW: 13.3 % (ref 11.5–15.5)
WBC: 13.4 10*3/uL — ABNORMAL HIGH (ref 4.0–10.5)

## 2017-03-12 LAB — BASIC METABOLIC PANEL
Anion gap: 11 (ref 5–15)
BUN: 22 mg/dL — ABNORMAL HIGH (ref 6–20)
CALCIUM: 9.4 mg/dL (ref 8.9–10.3)
CO2: 24 mmol/L (ref 22–32)
CREATININE: 1.33 mg/dL — AB (ref 0.61–1.24)
Chloride: 100 mmol/L — ABNORMAL LOW (ref 101–111)
GFR calc Af Amer: >60 mL/min (ref >60)
GFR calc non Af Amer: 54 mL/min — ABNORMAL LOW (ref >60)
GLUCOSE: 142 mg/dL — AB (ref 65–99)
Potassium: 4.6 mmol/L (ref 3.5–5.1)
Sodium: 135 mmol/L (ref 135–145)

## 2017-03-12 LAB — URINALYSIS, ROUTINE W REFLEX MICROSCOPIC
BILIRUBIN URINE: NEGATIVE
GLUCOSE, UA: NEGATIVE mg/dL
HGB URINE DIPSTICK: NEGATIVE
KETONES UR: NEGATIVE mg/dL
Leukocytes, UA: NEGATIVE
NITRITE: NEGATIVE
PH: 5 (ref 5.0–8.0)
Protein, ur: NEGATIVE mg/dL
SPECIFIC GRAVITY, URINE: 1.013 (ref 1.005–1.030)

## 2017-03-12 LAB — I-STAT TROPONIN, ED: Troponin i, poc: 0 ng/mL (ref 0.00–0.08)

## 2017-03-12 MED ORDER — ACETAMINOPHEN 500 MG PO TABS
1000.0000 mg | ORAL_TABLET | Freq: Once | ORAL | Status: AC
  Administered 2017-03-12: 1000 mg via ORAL
  Filled 2017-03-12: qty 2

## 2017-03-12 MED ORDER — MECLIZINE HCL 25 MG PO TABS
25.0000 mg | ORAL_TABLET | Freq: Once | ORAL | Status: AC
  Administered 2017-03-12: 25 mg via ORAL
  Filled 2017-03-12: qty 1

## 2017-03-12 MED ORDER — SODIUM CHLORIDE 0.9 % IV BOLUS (SEPSIS)
500.0000 mL | Freq: Once | INTRAVENOUS | Status: AC
  Administered 2017-03-12: 500 mL via INTRAVENOUS

## 2017-03-12 MED ORDER — DIAZEPAM 5 MG PO TABS
5.0000 mg | ORAL_TABLET | Freq: Once | ORAL | Status: AC
  Administered 2017-03-12: 5 mg via ORAL
  Filled 2017-03-12: qty 1

## 2017-03-12 MED ORDER — MECLIZINE HCL 25 MG PO TABS: 25.0000 mg | ORAL_TABLET | Freq: Three times a day (TID) | ORAL | 0 refills | Status: DC | PRN

## 2017-03-12 NOTE — ED Notes (Signed)
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  MRI recommends x-ray to verify no metal before scan.  Pt family in room and updated.  EDP aware.

## 2017-03-12 NOTE — ED Provider Notes (Signed)
[facility] EMERGENCY DEPARTMENT Provider Note   CSN: [id number] Arrival date & time: 03/12/17  1538     History   Chief Complaint Chief Complaint  Patient presents with  . Dizziness  . Nausea    HPI William HULSEBUS Sr. is a 67 y.o. male.  Patient is a 67 year old male with a history of hypertension who presents with dizziness.  He states he was sitting in a chair around 11:00 this morning and when he stood up he said he got very dizzy.  He felt like he had to go sit down.  He has a sensation that the room is spinning.  He has had the same sensations since it started 11:00.  He has had some nausea and a little bit of vomiting when he was in the ambulance.  He came in by EMS.  He has subsequently developed a headache, mostly to the frontal aspect of his head.  No history of recent trauma.  No vision changes.  No speech deficits.  No numbness or weakness to his extremities.  No associated chest pain or shortness of breath.  He has had some recent nasal congestion and a mild cough for about the last 2-3 weeks but no other recent illnesses.  No vomiting or diarrhea other than the vomiting today which was associated with the dizziness.  He was admitted last summer for a near syncopal episode.  He has not taken any medications today for the symptoms.      Past Medical History:  Diagnosis Date  . High cholesterol   . Hypertension     Patient Active Problem List   Diagnosis Date Noted  . Hyponatremia 09/06/2016  . Essential hypertension 09/06/2016  . Dyspnea on exertion 09/06/2016  . Near syncope 09/05/2016  . Postural dizziness with presyncope 09/05/2016    History reviewed. No pertinent surgical history.     Home Medications    Prior to Admission medications   Medication Sig Start Date End Date Taking? Authorizing Provider  atorvastatin (LIPITOR) 10 MG tablet Take 1 tablet by mouth daily. 07/27/16   [provider]  cloNIDine (CATAPRES) 0.2 MG  tablet Take 0.2 mg by mouth daily. 08/20/16   [provider]  hydrochlorothiazide (MICROZIDE) 12.5 MG capsule Take 1 capsule (12.5 mg total) by mouth daily. 09/07/16   Lanelle Bal, MD  lisinopril (PRINIVIL,ZESTRIL) 20 MG tablet Take 1 tablet (20 mg total) by mouth at bedtime. 09/07/16   Lanelle Bal, MD  meclizine (ANTIVERT) 25 MG tablet Take 1 tablet (25 mg total) by mouth 3 (three) times daily as needed for dizziness. 03/12/17   Rolan Bucco, MD    Family History History reviewed. No pertinent family history.  Social History Social History   Tobacco Use  . Smoking status: Current Every Day Smoker    Packs/day: 0.25    Types: Cigarettes  . Smokeless tobacco: Never Used  Substance Use Topics  . Alcohol use: No  . Drug use: No     Allergies   Patient has no known allergies.   Review of Systems Review of Systems  Constitutional: Negative for chills, diaphoresis, fatigue and fever.  HENT: Positive for congestion and rhinorrhea. Negative for sneezing.   Eyes: Negative.   Respiratory: Positive for cough. Negative for chest tightness and shortness of breath.   Cardiovascular: Negative for chest pain and leg swelling.  Gastrointestinal: Positive for nausea and vomiting. Negative for abdominal pain, blood in stool and diarrhea.  Genitourinary: Negative for difficulty  urinating, flank pain, frequency and hematuria.  Musculoskeletal: Negative for arthralgias and back pain.  Skin: Negative for rash.  Neurological: Positive for dizziness and headaches. Negative for speech difficulty, weakness and numbness.     Physical Exam Updated Vital Signs BP 129/71   Pulse (!) 58   Temp 98.2 F (36.8 C) (Oral)   Resp 20   Ht 5\' 11"  (1.803 m)   Wt 89.8 kg (198 lb)   SpO2 98%   BMI 27.62 kg/m   Physical Exam  Constitutional: He is oriented to person, place, and time. He appears well-developed and well-nourished.  HENT:  Head: Normocephalic and atraumatic.  Eyes:  Pupils are equal, round, and reactive to light.  Positive horizontal nystagmus with a fast component to the right.  There is some rotational nystagmus but no vertical nystagmus.  Neck: Normal range of motion. Neck supple.  Cardiovascular: Normal rate, regular rhythm and normal heart sounds.  Pulmonary/Chest: Effort normal and breath sounds normal. No respiratory distress. He has no wheezes. He has no rales. He exhibits no tenderness.  Abdominal: Soft. Bowel sounds are normal. There is no tenderness. There is no rebound and no guarding.  Musculoskeletal: Normal range of motion. He exhibits no edema.  Lymphadenopathy:    He has no cervical adenopathy.  Neurological: He is alert and oriented to person, place, and time.  Motor 5/5 all extremities Sensation grossly intact to LT all extremities Finger to Nose intact, no pronator drift CN II-XII grossly intact    Skin: Skin is warm and dry. No rash noted.  Psychiatric: He has a normal mood and affect.     ED Treatments / Results  Labs (all labs ordered are listed, but only abnormal results are displayed) Labs Reviewed  BASIC METABOLIC PANEL - Abnormal; Notable for the following components:      Result Value   Chloride 100 (*)    Glucose, Bld 142 (*)    BUN 22 (*)    Creatinine, Ser 1.33 (*)    GFR calc non Af Amer 54 (*)    All other components within normal limits  CBC WITH DIFFERENTIAL/PLATELET - Abnormal; Notable for the following components:   WBC 13.4 (*)    Neutro Abs 11.2 (*)    All other components within normal limits  URINALYSIS, ROUTINE W REFLEX MICROSCOPIC  I-STAT TROPONIN, ED    EKG  EKG Interpretation  Date/Time:  Friday March 12 2017 15:46:49 EST Ventricular Rate:  82 PR Interval:    QRS Duration: 91 QT Interval:  360 QTC Calculation: 421 R Axis:   47 Text Interpretation:  Sinus rhythm since last tracing no significant change Confirmed by Rolan Bucco [phone]) on 03/12/2017 4:05:43 PM        Radiology Dg Eye Foreign Body  Result Date: 03/12/2017 CLINICAL DATA:  Assess for FB for MRI; 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 Pt not great at following directions. EXAM: ORBITS FOR FOREIGN BODY - 2 VIEW COMPARISON:  None. FINDINGS: There is no evidence of metallic foreign body within the orbits. No significant bone abnormality identified. IMPRESSION: No evidence of metallic foreign body within the orbits. Electronically Signed   By: Norva Pavlov M.D.   On: 03/12/2017 20:51   Dg Chest 2 View  Result Date: 03/12/2017 CLINICAL DATA:  Cough EXAM: CHEST  2 VIEW COMPARISON:  09/06/2016 FINDINGS: Mild  hyperinflation of the lungs. Heart is upper limits normal in size. Nodule again noted in the right upper lobe, similar to prior study. This could be further evaluated with chest CT. No effusions or acute bony abnormality. IMPRESSION: Hyperinflation. Right upper lobe nodule. Recommend further evaluation with chest CT without IV contrast. Electronically Signed   By: Charlett Nose M.D.   On: 03/12/2017 17:16   Mr Brain Wo Contrast  Result Date: 03/12/2017 CLINICAL DATA:  Initial evaluation for acute ataxia, stroke suspected. EXAM: MRI HEAD WITHOUT CONTRAST TECHNIQUE: Multiplanar, multiecho pulse sequences of the brain and surrounding structures were obtained without intravenous contrast. COMPARISON:  None available. FINDINGS: Brain: Age-appropriate cerebral atrophy. Patchy T2/FLAIR hyperintensity within the periventricular and deep white matter, most consistent with chronic microvascular ischemic changes, mild for age. No abnormal foci of restricted diffusion to suggest acute or subacute ischemia. Gray-white matter differentiation maintained. Small remote lacunar infarct noted within the right pons. No other areas of remote cortical infarction. No evidence for acute or chronic intracranial  hemorrhage. No mass lesion, midline shift or mass effect. No hydrocephalus. No extra-axial fluid collection. Major dural sinuses are grossly patent. Pituitary gland suprasellar region normal. Midline structures intact and normal. Vascular: Major intracranial vascular flow voids are maintained. Skull and upper cervical spine: Craniocervical junction normal. Upper cervical spine normal. Bone marrow signal intensity within normal limits. No scalp soft tissue abnormality. Sinuses/Orbits: Globes orbital soft tissues within normal limits. Patient status post cataract extraction on right. Mild mucosal thickening within the ethmoidal air cells and maxillary sinuses. No mastoid effusion. Inner ear structures grossly normal. Other: None. IMPRESSION: 1. No acute intracranial abnormality. 2. Age-related cerebral atrophy with mild chronic small vessel ischemic disease. 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 Electronically Signed   By: Rise Mu M.D.   On: 03/12/2017 22:05    Procedures Procedures (including critical care time)  Medications Ordered in ED Medications  sodium chloride 0.9 % bolus 500 mL (0 mLs Intravenous Stopped 03/12/17 1924)  meclizine (ANTIVERT) tablet 25 mg (25 mg Oral Given 03/12/17 1743)  diazepam (VALIUM) tablet 5 mg (5 mg Oral Given 03/12/17 1945)  acetaminophen (TYLENOL) tablet 1,000 mg (1,000 mg Oral Given 03/12/17 1945)     Initial Impression / Assessment and Plan / ED Course  I have reviewed the triage vital signs and the nursing notes.  Pertinent labs & imaging results that were available during my care of the patient were reviewed by me and considered in my medical decision making (see chart for details).     Patient is a 67 year old male who presents with vertigo.  He did have an associated headache and his symptoms did not resolve after meclizine so I did do an MRI which showed no evidence of acute stroke or other abnormality.  He was given IV fluids.  His  labs are non-concerning.  He did have a small pulmonary nodule which is unchanged from the prior x-ray.  I did give the patient this information and advised him that he may need to get a CT scan of his chest to better assess this.  I also put that on his discharge paperwork so he can follow-up with his PCP.  He was advised to follow-up with his PCP early next week or return here as needed for any worsening symptoms.  Final Clinical Impressions(s) / ED Diagnoses   Final diagnoses:  Vertigo  Lung nodule    ED Discharge Orders        Ordered  meclizine (ANTIVERT) 25 MG tablet  3 times daily PRN     03/12/17 2233       Rolan BuccoBelfi, Jami Ohlin, MD 03/12/17 2242

## 2017-03-12 NOTE — ED Notes (Signed)
Pt ambulated around pod with no assistance or difficulty. Pt has no complaints at this time

## 2017-03-12 NOTE — Discharge Instructions (Addendum)
You may need to have a CT scan to better assess a lung nodule.

## 2017-03-12 NOTE — ED Notes (Signed)
Patient denies pain, continues to report nausea

## 2017-03-12 NOTE — ED Notes (Signed)
Patient transported to MRI 

## 2017-03-12 NOTE — ED Triage Notes (Signed)
Patient reports feeling dizzy, he reports nausea, denies vomiting. He states "I got up and didn't think I could stay up"

## 2017-06-29 ENCOUNTER — Other Ambulatory Visit: Payer: Self-pay | Admitting: Internal Medicine

## 2017-06-30 ENCOUNTER — Other Ambulatory Visit: Payer: Self-pay | Admitting: Internal Medicine

## 2017-09-29 ENCOUNTER — Ambulatory Visit (INDEPENDENT_AMBULATORY_CARE_PROVIDER_SITE_OTHER): Payer: Medicare Other | Admitting: Adult Health

## 2017-09-29 ENCOUNTER — Encounter: Payer: Self-pay | Admitting: Adult Health

## 2017-09-29 VITALS — BP 189/93 | HR 73 | Ht 69.0 in | Wt 208.0 lb

## 2017-09-29 DIAGNOSIS — E7800 Pure hypercholesterolemia, unspecified: Secondary | ICD-10-CM | POA: Diagnosis not present

## 2017-09-29 DIAGNOSIS — R42 Dizziness and giddiness: Secondary | ICD-10-CM | POA: Insufficient documentation

## 2017-09-29 DIAGNOSIS — I10 Essential (primary) hypertension: Secondary | ICD-10-CM | POA: Diagnosis not present

## 2017-09-29 DIAGNOSIS — Z23 Encounter for immunization: Secondary | ICD-10-CM | POA: Diagnosis not present

## 2017-09-29 DIAGNOSIS — H8190 Unspecified disorder of vestibular function, unspecified ear: Secondary | ICD-10-CM | POA: Insufficient documentation

## 2017-09-29 DIAGNOSIS — E782 Mixed hyperlipidemia: Secondary | ICD-10-CM | POA: Insufficient documentation

## 2017-09-29 MED ORDER — BISOPROLOL FUMARATE 5 MG PO TABS: 5.0000 mg | ORAL_TABLET | Freq: Every day | ORAL | 0 refills | Status: DC

## 2017-09-29 MED ORDER — LISINOPRIL 20 MG PO TABS: 20.0000 mg | ORAL_TABLET | Freq: Every day | ORAL | 1 refills | Status: DC

## 2017-09-29 MED ORDER — LISINOPRIL 20 MG PO TABS: 20.0000 mg | ORAL_TABLET | Freq: Every day | ORAL | 3 refills | Status: DC

## 2017-09-29 MED ORDER — ATORVASTATIN CALCIUM 10 MG PO TABS: 10.0000 mg | ORAL_TABLET | Freq: Every day | ORAL | 3 refills | Status: DC

## 2017-09-29 NOTE — Assessment & Plan Note (Addendum)
All three readings are well above goal He is currently taking- Bisoprolol 5mg - 1/2 tablet daily Lisinopril 20mg  daily He denies acute cardiac sx's CALL YOUR CARDIOLOGIST about elevated readings, your medications may need to be adjusted

## 2017-09-29 NOTE — Assessment & Plan Note (Signed)
Vertigo dx'd 03/2017- treated with Meclizine 25mg  TID PRN

## 2017-09-29 NOTE — Patient Instructions (Addendum)

## 2017-09-29 NOTE — Assessment & Plan Note (Signed)
>>  ASSESSMENT AND PLAN FOR VERTIGO WRITTEN ON 09/29/2017  4:46 PM BY DANFORD, KATY D, NP  Vertigo dx'd 03/2017- treated with Meclizine 25mg  TID PRN

## 2017-09-29 NOTE — Assessment & Plan Note (Signed)
>>  ASSESSMENT AND PLAN FOR HIGH CHOLESTEROL WRITTEN ON 09/29/2017  4:46 PM BY DANFORD, KATY D, NP  Will obtain fasting labs Currently on Atorvastatin 10mg  QD

## 2017-09-29 NOTE — Progress Notes (Addendum)
Subjective:    Patient ID: William MoutonMichael A Moulin Sr., male    DOB: 18-Jun-1950, 67 y.o.   MRN: [id number]  HPI:  [name] is here to establish as a new pt.  He is a pleasant 67 year old male. PMH:   Near syncopal event- 08/2016- antihypertensives were adjusted and he was referred to [facility]- records requested. Per pt, he has had Holter Study, Echo 09/06/16 Echo- Impressions:  - Mild LVH with LVEF 60-65%, grade 1 diastolic dysfunction. Upper   normal left atrial chamber size. Calcified mitral annulus with   mild mitral regurgitation. Trivial tricuspid regurgitation.  Cards re-started him on Bisoprolol 5mg - 1/2 tablet daily last year. He is also on Lisinopril 20mg  QD He reports taking his medications at bedtime due to SE  Vertigo dx'd 03/2017- treated with Meclizine 25mg  TID PRN  He estimates to drink 40 oz water a day and eats "what's ever is around". He stopped ETOH use 2012 and tobacco use 2018 He denies CP/dyspnea/dizziness/HA/palpiations, he states "I fell fine" He reports his home readings- SBPs 130-140, DBPs 70-90  Patient Care Team    Relationship Specialty Notifications Start End  Julaine Fusianford, Staphanie Harbison D, NP PCP - General Family Medicine  09/29/17   Elder NegusPatwardhan, Manish J, MD Consulting Physician Cardiology  09/29/17   Lanelle BalHarbrecht, Lawrence, MD Resident Internal Medicine  09/29/17     Patient Active Problem List   Diagnosis Date Noted  . High cholesterol 09/29/2017  . Vertigo 09/29/2017  . Hyponatremia 09/06/2016  . Essential hypertension 09/06/2016  . Dyspnea on exertion 09/06/2016  . Near syncope 09/05/2016  . Postural dizziness with presyncope 09/05/2016     Past Medical History:  Diagnosis Date  . High cholesterol   . Hypertension      History reviewed. No pertinent surgical history.   History reviewed. No pertinent family history.   Social History   Substance and Sexual Activity  Drug Use No     Social History   Substance and Sexual  Activity  Alcohol Use No   Comment: Quit drinking alcohol 10/20/2010     Social History   Tobacco Use  Smoking Status Former Smoker  . Packs/day: 1.50  . Years: 40.00  . Pack years: 60.00  . Types: Cigarettes  Smokeless Tobacco Never Used     Outpatient Encounter Medications as of 09/29/2017  Medication Sig  . atorvastatin (LIPITOR) 10 MG tablet Take 1 tablet (10 mg total) by mouth daily.  . bisoprolol (ZEBETA) 5 MG tablet Take 2.5 mg by mouth daily.  Marland Kitchen. lisinopril (PRINIVIL,ZESTRIL) 20 MG tablet Take 1 tablet (20 mg total) by mouth at bedtime.  . meclizine (ANTIVERT) 25 MG tablet Take 1 tablet (25 mg total) by mouth 3 (three) times daily as needed for dizziness.  . [DISCONTINUED] atorvastatin (LIPITOR) 10 MG tablet Take 1 tablet by mouth daily.  . [DISCONTINUED] bisoprolol (ZEBETA) 5 MG tablet Take 5 mg by mouth daily.  . [DISCONTINUED] bisoprolol (ZEBETA) 5 MG tablet Take 1 tablet (5 mg total) by mouth daily.  . [DISCONTINUED] bisoprolol (ZEBETA) 5 MG tablet Take 1 tablet (5 mg total) by mouth daily. 1/2 tablet daily  . [DISCONTINUED] lisinopril (PRINIVIL,ZESTRIL) 20 MG tablet Take 1 tablet (20 mg total) by mouth at bedtime.  . [DISCONTINUED] lisinopril (PRINIVIL,ZESTRIL) 20 MG tablet Take 1 tablet (20 mg total) by mouth at bedtime.  . [DISCONTINUED] cloNIDine (CATAPRES) 0.2 MG tablet Take 0.2 mg by mouth daily.  . [DISCONTINUED] hydrochlorothiazide (MICROZIDE) 12.5 MG capsule Take 1  capsule (12.5 mg total) by mouth daily.   No facility-administered encounter medications on file as of 09/29/2017.     Allergies: Clonidine derivatives  Body mass index is 30.72 kg/m.  Blood pressure (!) 189/93, pulse 73, height 5\' 9"  (1.753 m), weight 208 lb (94.3 kg), SpO2 95 %.  Review of Systems  Constitutional: Positive for fatigue. Negative for activity change, appetite change, chills, diaphoresis, fever and unexpected weight change.  Eyes: Negative for visual disturbance.  Respiratory:  Negative for cough, chest tightness, shortness of breath, wheezing and stridor.   Cardiovascular: Negative for chest pain, palpitations and leg swelling.  Gastrointestinal: Negative for abdominal distention, abdominal pain, blood in stool, constipation, diarrhea, nausea and vomiting.  Genitourinary: Negative for difficulty urinating and flank pain.  Musculoskeletal: Positive for arthralgias and myalgias. Negative for back pain, gait problem, joint swelling, neck pain and neck stiffness.  Skin: Negative for color change, pallor, rash and wound.  Neurological: Negative for dizziness, tremors, weakness and headaches.  Hematological: Does not bruise/bleed easily.  Psychiatric/Behavioral: Negative for behavioral problems, confusion, decreased concentration, dysphoric mood, hallucinations, self-injury, sleep disturbance and suicidal ideas. The patient is not nervous/anxious and is not hyperactive.        Objective:   Physical Exam  Constitutional: He is oriented to person, place, and time. He appears well-developed and well-nourished. No distress.  HENT:  Head: Normocephalic and atraumatic.  Right Ear: External ear normal. Decreased hearing is noted.  Left Ear: External ear normal. Decreased hearing is noted.  Nose: Nose normal.  Mouth/Throat: Oropharynx is clear and moist.  Eyes: Pupils are equal, round, and reactive to light. Conjunctivae and EOM are normal.  Cardiovascular: Normal rate, regular rhythm, normal heart sounds and intact distal pulses.  No murmur heard. Pulmonary/Chest: Effort normal and breath sounds normal. No stridor. No respiratory distress. He has no wheezes. He has no rales. He exhibits no tenderness.  Neurological: He is alert and oriented to person, place, and time.  Skin: Skin is warm and dry. Capillary refill takes less than 2 seconds. No rash noted. He is not diaphoretic. No erythema. No pallor.  Psychiatric: He has a normal mood and affect. His behavior is normal.  Judgment and thought content normal.  Nursing note and vitals reviewed.     Assessment & Plan:   1. Need for pneumococcal vaccine   2. Essential hypertension   3. High cholesterol   4. Vertigo     Essential hypertension All three readings are well above goal He is currently taking- Bisoprolol 5mg - 1/2 tablet daily Lisinopril 20mg  daily CALL YOUR CARDIOLOGIST about elevated readings, your medications may need to be adjusted  High cholesterol Will obtain fasting labs Currently on Atorvastatin 10mg  QD  Vertigo Vertigo dx'd 03/2017- treated with Meclizine 25mg  TID PRN    FOLLOW-UP:  Return in about 3 weeks (around 10/20/2017) for CPE, Fasting Labs.

## 2017-09-29 NOTE — Assessment & Plan Note (Signed)
Will obtain fasting labs Currently on Atorvastatin 10mg  QD

## 2017-10-13 ENCOUNTER — Other Ambulatory Visit: Payer: Medicare Other

## 2017-10-13 ENCOUNTER — Other Ambulatory Visit: Payer: Self-pay

## 2017-10-13 DIAGNOSIS — E871 Hypo-osmolality and hyponatremia: Secondary | ICD-10-CM

## 2017-10-13 DIAGNOSIS — I10 Essential (primary) hypertension: Secondary | ICD-10-CM

## 2017-10-13 DIAGNOSIS — Z0000 Encounter for general adult medical examination without abnormal findings: Secondary | ICD-10-CM

## 2017-10-13 DIAGNOSIS — E7800 Pure hypercholesterolemia, unspecified: Secondary | ICD-10-CM

## 2017-10-14 ENCOUNTER — Other Ambulatory Visit: Payer: Self-pay | Admitting: Adult Health

## 2017-10-14 DIAGNOSIS — D72829 Elevated white blood cell count, unspecified: Secondary | ICD-10-CM

## 2017-10-14 LAB — COMPREHENSIVE METABOLIC PANEL
ALBUMIN: 4.5 g/dL (ref 3.6–4.8)
ALT: 27 IU/L (ref 0–44)
AST: 25 IU/L (ref 0–40)
Albumin/Globulin Ratio: 1.6 (ref 1.2–2.2)
Alkaline Phosphatase: 111 IU/L (ref 39–117)
BUN / CREAT RATIO: 13 (ref 10–24)
BUN: 15 mg/dL (ref 8–27)
Bilirubin Total: 0.6 mg/dL (ref 0.0–1.2)
CALCIUM: 9.9 mg/dL (ref 8.6–10.2)
CO2: 22 mmol/L (ref 20–29)
CREATININE: 1.14 mg/dL (ref 0.76–1.27)
Chloride: 100 mmol/L (ref 96–106)
GFR calc Af Amer: 77 mL/min/{1.73_m2} (ref >59)
GFR, EST NON AFRICAN AMERICAN: 66 mL/min/{1.73_m2} (ref >59)
GLOBULIN, TOTAL: 2.9 g/dL (ref 1.5–4.5)
Glucose: 112 mg/dL — ABNORMAL HIGH (ref 65–99)
Potassium: 4.4 mmol/L (ref 3.5–5.2)
SODIUM: 139 mmol/L (ref 134–144)
Total Protein: 7.4 g/dL (ref 6.0–8.5)

## 2017-10-14 LAB — CBC WITH DIFFERENTIAL/PLATELET
BASOS: 1 %
Basophils Absolute: 0.1 10*3/uL (ref 0.0–0.2)
EOS (ABSOLUTE): 0.5 10*3/uL — AB (ref 0.0–0.4)
EOS: 4 %
HEMATOCRIT: 51.4 % — AB (ref 37.5–51.0)
Hemoglobin: 17 g/dL (ref 13.0–17.7)
Immature Grans (Abs): 0 10*3/uL (ref 0.0–0.1)
Immature Granulocytes: 0 %
LYMPHS ABS: 2.9 10*3/uL (ref 0.7–3.1)
Lymphs: 25 %
MCH: 28 pg (ref 26.6–33.0)
MCHC: 33.1 g/dL (ref 31.5–35.7)
MCV: 85 fL (ref 79–97)
MONOS ABS: 0.7 10*3/uL (ref 0.1–0.9)
Monocytes: 6 %
NEUTROS ABS: 7.2 10*3/uL — AB (ref 1.4–7.0)
NEUTROS PCT: 64 %
PLATELETS: 366 10*3/uL (ref 150–450)
RBC: 6.08 x10E6/uL — ABNORMAL HIGH (ref 4.14–5.80)
RDW: 12.3 % (ref 12.3–15.4)
WBC: 11.4 10*3/uL — ABNORMAL HIGH (ref 3.4–10.8)

## 2017-10-14 LAB — LIPID PANEL
CHOL/HDL RATIO: 4 ratio (ref 0.0–5.0)
CHOLESTEROL TOTAL: 178 mg/dL (ref 100–199)
HDL: 44 mg/dL (ref >39)
LDL Calculated: 116 mg/dL — ABNORMAL HIGH (ref 0–99)
Triglycerides: 92 mg/dL (ref 0–149)
VLDL Cholesterol Cal: 18 mg/dL (ref 5–40)

## 2017-10-14 LAB — HEMOGLOBIN A1C
Est. average glucose Bld gHb Est-mCnc: 111 mg/dL
HEMOGLOBIN A1C: 5.5 % (ref 4.8–5.6)

## 2017-10-14 LAB — TSH: TSH: 2.29 u[IU]/mL (ref 0.450–4.500)

## 2017-10-14 MED ORDER — ATORVASTATIN CALCIUM 20 MG PO TABS: 20.0000 mg | ORAL_TABLET | Freq: Every day | ORAL | 1 refills | Status: DC

## 2017-10-18 NOTE — Progress Notes (Signed)
Subjective:    Patient ID: William Mouton Sr., male    DOB: 1950-05-03, 67 y.o.   MRN: [id number]  HPI: 09/29/17 OV:  William Hogan is here to establish as a new pt.  He is a pleasant 67 year old male. PMH:  Near syncopal event- 08/2016- antihypertensives were adjusted and he was referred to [facility]- records requested. Per pt, he has had Holter Study, Echo 09/06/16 Echo- Impressions:  - Mild LVH with LVEF 60-65%, grade 1 diastolic dysfunction. Upper   normal left atrial chamber size. Calcified mitral annulus with   mild mitral regurgitation. Trivial tricuspid regurgitation.  Cards re-started him on Bisoprolol 5mg - 1/2 tablet daily last year. He is also on Lisinopril 20mg  QD He reports taking his medications at bedtime due to SE  Vertigo dx'd 03/2017- treated with Meclizine 25mg  TID PRN  He estimates to drink 40 oz water a day and eats "what's ever is around". He stopped ETOH use 2012 and tobacco use 2018 He denies CP/dyspnea/dizziness/HA/palpiations, he states "I fell fine" He reports his home readings- SBPs 130-140, DBPs 70-90  10/20/17 OV: William Hogan is here for CPE He was seen by [facility] last week and adjustments were made to his anti-hypertensives: Lisinopril increased from 20mg  to 40mg  He was instructed that if home SBP was >140 to take full tablet of bisoprolol 5mg , if SBP <140 then only 1/2 tab of bisoprolol 5mg  He reports home SBP have been consistently running 130 He denies CP/dyspnea/dizzness/HA/palpitations  He has been increasing water and fruit intake He denies formal exercise, but states "I do a lot of walking around" He denies tobacco/ETOH use Reviewed all recent lab results  Healthcare Maintenance: Colonoscopy-pt declined Immunizations-pt declined AAA Screening-pt declined LDCT- order placed Discussed the importance of having screening tests and the risks associated with not completing measures.  Patient Care Team     Relationship Specialty Notifications Start End  William Hamburger D, NP PCP - General Family Medicine  09/29/17   Elder Negus, MD Consulting Physician Cardiology  09/29/17   Lanelle Bal, MD Resident Internal Medicine  09/29/17     Patient Active Problem List   Diagnosis Date Noted  . Healthcare maintenance 10/20/2017  . High cholesterol 09/29/2017  . Vertigo 09/29/2017  . Hyponatremia 09/06/2016  . Essential hypertension 09/06/2016  . Dyspnea on exertion 09/06/2016  . Near syncope 09/05/2016  . Postural dizziness with presyncope 09/05/2016     Past Medical History:  Diagnosis Date  . High cholesterol   . Hypertension      History reviewed. No pertinent surgical history.   History reviewed. No pertinent family history.   Social History   Substance and Sexual Activity  Drug Use No     Social History   Substance and Sexual Activity  Alcohol Use No   Comment: Quit drinking alcohol 10/20/2010     Social History   Tobacco Use  Smoking Status Former Smoker  . Packs/day: 1.50  . Years: 40.00  . Pack years: 60.00  . Types: Cigarettes  Smokeless Tobacco Never Used     Outpatient Encounter Medications as of 10/20/2017  Medication Sig  . atorvastatin (LIPITOR) 20 MG tablet Take 1 tablet (20 mg total) by mouth daily.  . bisoprolol (ZEBETA) 5 MG tablet Take 2.5 mg by mouth daily.  Marland Kitchen lisinopril (PRINIVIL,ZESTRIL) 40 MG tablet Take 40 mg by mouth daily.  . meclizine (ANTIVERT) 25 MG tablet Take 1 tablet (25 mg total) by mouth 3 (three) times daily  as needed for dizziness.  . [DISCONTINUED] lisinopril (PRINIVIL,ZESTRIL) 20 MG tablet Take 1 tablet (20 mg total) by mouth at bedtime.   No facility-administered encounter medications on file as of 10/20/2017.     Allergies: Clonidine derivatives  Body mass index is 29.87 kg/m.  Blood pressure (!) 146/78, pulse 84, height 5\' 9"  (1.753 m), weight 202 lb 4.8 oz (91.8 kg), SpO2 93 %.  Review of Systems   Constitutional: Positive for fatigue. Negative for activity change, appetite change, chills, diaphoresis, fever and unexpected weight change.  Eyes: Negative for visual disturbance.  Respiratory: Negative for cough, chest tightness, shortness of breath, wheezing and stridor.   Cardiovascular: Negative for chest pain, palpitations and leg swelling.  Gastrointestinal: Negative for abdominal distention, abdominal pain, blood in stool, constipation, diarrhea, nausea and vomiting.  Genitourinary: Negative for difficulty urinating and flank pain.  Musculoskeletal: Positive for arthralgias and myalgias. Negative for back pain, gait problem, joint swelling, neck pain and neck stiffness.  Skin: Negative for color change, pallor, rash and wound.  Neurological: Negative for dizziness, tremors, weakness and headaches.  Hematological: Does not bruise/bleed easily.  Psychiatric/Behavioral: Negative for behavioral problems, confusion, decreased concentration, dysphoric mood, hallucinations, self-injury, sleep disturbance and suicidal ideas. The patient is not nervous/anxious and is not hyperactive.        Objective:   Physical Exam  Constitutional: He is oriented to person, place, and time. He appears well-developed and well-nourished. No distress.  HENT:  Head: Normocephalic and atraumatic.  Right Ear: External ear normal. Tympanic membrane is not erythematous and not bulging. Decreased hearing is noted.  Left Ear: External ear normal. Tympanic membrane is not erythematous and not bulging. Decreased hearing is noted.  Nose: Rhinorrhea present. No mucosal edema. Right sinus exhibits no maxillary sinus tenderness and no frontal sinus tenderness. Left sinus exhibits no maxillary sinus tenderness and no frontal sinus tenderness.  Mouth/Throat: Oropharynx is clear and moist and mucous membranes are normal. Abnormal dentition. Dental caries present.  Eyes: Pupils are equal, round, and reactive to light.  Conjunctivae and EOM are normal.  Neck: Normal range of motion. Neck supple.  Cardiovascular: Normal rate, regular rhythm, normal heart sounds and intact distal pulses.  No murmur heard. Pulmonary/Chest: Effort normal and breath sounds normal. No stridor. No respiratory distress. He has no wheezes. He has no rales. He exhibits no tenderness.  Abdominal: Soft. Bowel sounds are normal. He exhibits no distension and no mass. There is no tenderness. There is no rebound and no guarding. No hernia.  Genitourinary:  Genitourinary Comments: Declined rectal/genital examination   Musculoskeletal: He exhibits no edema or tenderness.  Lymphadenopathy:    He has no cervical adenopathy.  Neurological: He is alert and oriented to person, place, and time. He displays normal reflexes.  Skin: Skin is warm and dry. Capillary refill takes less than 2 seconds. No rash noted. He is not diaphoretic. No erythema. No pallor.  Psychiatric: He has a normal mood and affect. His behavior is normal. Judgment and thought content normal.  Nursing note and vitals reviewed.     Assessment & Plan:   1. Healthcare maintenance   2. Essential hypertension   3. High cholesterol     Healthcare maintenance Continue all medications as directed. Stay well hydrated with water and follow Mediterranean diet. Continue to walk as often as possible. Continue to check blood pressure and heart rate at home. If you have having consistently elevated readings (>150/90) or low readings (<100/60), please call your Cardiologist. Please return  in 4 weeks for lab appt- liver function panel since we increased your Atorvastatin (Lipitor). Please return in 5 months for regular follow and fasting labs.  Essential hypertension BP Per recent cards visit: Lisinopril increased from 20mg  to 40mg  He was instructed that if home SBP was >140 to take full tablet of bisoprolol 5mg , if SBP <140 then only 1/2 tab of bisoprolol 5mg  He reports home SBP  have been consistently running 130 He denies CP/dyspnea/dizzness/HA/palpitations   High cholesterol The 10-year ASCVD risk score Denman George DC Jr., et al., 2013) is: 25.9%   Values used to calculate the score:     Age: 32 years     Sex: Male     Is Non-Hispanic African American: No     Diabetic: No     Tobacco smoker: No     Systolic Blood Pressure: 167 mmHg     Is BP treated: Yes     HDL Cholesterol: 44 mg/dL     Total Cholesterol: 178 mg/dL  [id number] Atorvastatin increased from 10mg  to 20mg  Will recheck LFTS in 4 weeks, lipids 5 months Follow Mediterranean diet, move as much as possible.   FOLLOW-UP:  Return in about 4 months (around 02/19/2018) for Lab Work, Regular Follow Up.

## 2017-10-20 ENCOUNTER — Ambulatory Visit (INDEPENDENT_AMBULATORY_CARE_PROVIDER_SITE_OTHER): Payer: Medicare Other | Admitting: Adult Health

## 2017-10-20 ENCOUNTER — Encounter: Payer: Self-pay | Admitting: Adult Health

## 2017-10-20 VITALS — BP 146/78 | HR 84 | Ht 69.0 in | Wt 202.3 lb

## 2017-10-20 DIAGNOSIS — I10 Essential (primary) hypertension: Secondary | ICD-10-CM | POA: Diagnosis not present

## 2017-10-20 DIAGNOSIS — E7800 Pure hypercholesterolemia, unspecified: Secondary | ICD-10-CM | POA: Diagnosis not present

## 2017-10-20 DIAGNOSIS — Z122 Encounter for screening for malignant neoplasm of respiratory organs: Secondary | ICD-10-CM

## 2017-10-20 DIAGNOSIS — Z0000 Encounter for general adult medical examination without abnormal findings: Secondary | ICD-10-CM | POA: Diagnosis not present

## 2017-10-20 NOTE — Assessment & Plan Note (Signed)
The 10-year ASCVD risk score Denman George DC Montez Hageman., et al., 2013) is: 25.9%   Values used to calculate the score:     Age: 67 years     Sex: Male     Is Non-Hispanic African American: No     Diabetic: No     Tobacco smoker: No     Systolic Blood Pressure: 167 mmHg     Is BP treated: Yes     HDL Cholesterol: 44 mg/dL     Total Cholesterol: 178 mg/dL  [id number] Atorvastatin increased from 10mg  to 20mg  Will recheck LFTS in 4 weeks, lipids 5 months Follow Mediterranean diet, move as much as possible.

## 2017-10-20 NOTE — Patient Instructions (Signed)
Preventive Care for Adults, Male A healthy lifestyle and preventive care can promote health and wellness. Preventive health guidelines for men include the following key practices:  A routine yearly physical is a good way to check with your health care provider about your health and preventative screening. It is a chance to share any concerns and updates on your health and to receive a thorough exam.  Visit your dentist for a routine exam and preventative care every 6 months. Brush your teeth twice a day and floss once a day. Good oral hygiene prevents tooth decay and gum disease.  The frequency of eye exams is based on your age, health, family medical history, use of contact lenses, and other factors. Follow your health care provider's recommendations for frequency of eye exams.  Eat a healthy diet. Foods such as vegetables, fruits, whole grains, low-fat dairy products, and lean protein foods contain the nutrients you need without too many calories. Decrease your intake of foods high in solid fats, added sugars, and salt. Eat the right amount of calories for you.Get information about a proper diet from your health care provider, if necessary.  Regular physical exercise is one of the most important things you can do for your health. Most adults should get at least 150 minutes of moderate-intensity exercise (any activity that increases your heart rate and causes you to sweat) each week. In addition, most adults need muscle-strengthening exercises on 2 or more days a week.  Maintain a healthy weight. The body mass index (BMI) is a screening tool to identify possible weight problems. It provides an estimate of body fat based on height and weight. Your health care provider can find your BMI and can help you achieve or maintain a healthy weight.For adults 20 years and older:  A BMI below 18.5 is considered underweight.  A BMI of 18.5 to 24.9 is normal.  A BMI of 25 to 29.9 is considered  overweight.  A BMI of 30 and above is considered obese.  Maintain normal blood lipids and cholesterol levels by exercising and minimizing your intake of saturated fat. Eat a balanced diet with plenty of fruit and vegetables. Blood tests for lipids and cholesterol should begin at age 55 and be repeated every 5 years. If your lipid or cholesterol levels are high, you are over 50, or you are at high risk for heart disease, you may need your cholesterol levels checked more frequently.Ongoing high lipid and cholesterol levels should be treated with medicines if diet and exercise are not working.  If you smoke, find out from your health care provider how to quit. If you do not use tobacco, do not start.  Lung cancer screening is recommended for adults aged 90-80 years who are at high risk for developing lung cancer because of a history of smoking. A yearly low-dose CT scan of the lungs is recommended for people who have at least a 30-pack-year history of smoking and are a current smoker or have quit within the past 15 years. A pack year of smoking is smoking an average of 1 pack of cigarettes a day for 1 year (for example: 1 pack a day for 30 years or 2 packs a day for 15 years). Yearly screening should continue until the smoker has stopped smoking for at least 15 years. Yearly screening should be stopped for people who develop a health problem that would prevent them from having lung cancer treatment.  If you choose to drink alcohol, do not have more  than 2 drinks per day. One drink is considered to be 12 ounces (355 mL) of beer, 5 ounces (148 mL) of wine, or 1.5 ounces (44 mL) of liquor.  Avoid use of street drugs. Do not share needles with anyone. Ask for help if you need support or instructions about stopping the use of drugs.  High blood pressure causes heart disease and increases the risk of stroke. Your blood pressure should be checked at least every 1-2 years. Ongoing high blood pressure should be  treated with medicines, if weight loss and exercise are not effective.  If you are 34-90 years old, ask your health care provider if you should take aspirin to prevent heart disease.  Diabetes screening is done by taking a blood sample to check your blood glucose level after you have not eaten for a certain period of time (fasting). If you are not overweight and you do not have risk factors for diabetes, you should be screened once every 3 years starting at age 35. If you are overweight or obese and you are 70-84 years of age, you should be screened for diabetes every year as part of your cardiovascular risk assessment.  Colorectal cancer can be detected and often prevented. Most routine colorectal cancer screening begins at the age of 18 and continues through age 69. However, your health care provider may recommend screening at an earlier age if you have risk factors for colon cancer. On a yearly basis, your health care provider may provide home test kits to check for hidden blood in the stool. Use of a small camera at the end of a tube to directly examine the colon (sigmoidoscopy or colonoscopy) can detect the earliest forms of colorectal cancer. Talk to your health care provider about this at age 71, when routine screening begins. Direct exam of the colon should be repeated every 5-10 years through age 18, unless early forms of precancerous polyps or small growths are found.  People who are at an increased risk for hepatitis B should be screened for this virus. You are considered at high risk for hepatitis B if:  You were born in a country where hepatitis B occurs often. Talk with your health care provider about which countries are considered high risk.  Your parents were born in a high-risk country and you have not received a shot to protect against hepatitis B (hepatitis B vaccine).  You have HIV or AIDS.  You use needles to inject street drugs.  You live with, or have sex with, someone who  has hepatitis B.  You are a man who has sex with other men (MSM).  You get hemodialysis treatment.  You take certain medicines for conditions such as cancer, organ transplantation, and autoimmune conditions.  Hepatitis C blood testing is recommended for all people born from [birth year] through 1965 and any individual with known risks for hepatitis C.  Practice safe sex. Use condoms and avoid high-risk sexual practices to reduce the spread of sexually transmitted infections (STIs). STIs include gonorrhea, chlamydia, syphilis, trichomonas, herpes, HPV, and human immunodeficiency virus (HIV). Herpes, HIV, and HPV are viral illnesses that have no cure. They can result in disability, cancer, and death.  If you are a man who has sex with other men, you should be screened at least once per year for:  HIV.  Urethral, rectal, and pharyngeal infection of gonorrhea, chlamydia, or both.  If you are at risk of being infected with HIV, it is recommended that you take a  prescription medicine daily to prevent HIV infection. This is called preexposure prophylaxis (PrEP). You are considered at risk if:  You are a man who has sex with other men (MSM) and have other risk factors.  You are a heterosexual man, are sexually active, and are at increased risk for HIV infection.  You take drugs by injection.  You are sexually active with a partner who has HIV.  Talk with your health care provider about whether you are at high risk of being infected with HIV. If you choose to begin PrEP, you should first be tested for HIV. You should then be tested every 3 months for as long as you are taking PrEP.  A one-time screening for abdominal aortic aneurysm (AAA) and surgical repair of large AAAs by ultrasound are recommended for men ages 44 to 66 years who are current or former smokers.  Healthy men should no longer receive prostate-specific antigen (PSA) blood tests as part of routine cancer screening. Talk with your health  care provider about prostate cancer screening.  Testicular cancer screening is not recommended for adult males who have no symptoms. Screening includes self-exam, a health care provider exam, and other screening tests. Consult with your health care provider about any symptoms you have or any concerns you have about testicular cancer.  Use sunscreen. Apply sunscreen liberally and repeatedly throughout the day. You should seek shade when your shadow is shorter than you. Protect yourself by wearing long sleeves, pants, a wide-brimmed hat, and sunglasses year round, whenever you are outdoors.  Once a month, do a whole-body skin exam, using a mirror to look at the skin on your back. Tell your health care provider about new moles, moles that have irregular borders, moles that are larger than a pencil eraser, or moles that have changed in shape or color.  Stay current with required vaccines (immunizations).  Influenza vaccine. All adults should be immunized every year.  Tetanus, diphtheria, and acellular pertussis (Td, Tdap) vaccine. An adult who has not previously received Tdap or who does not know his vaccine status should receive 1 dose of Tdap. This initial dose should be followed by tetanus and diphtheria toxoids (Td) booster doses every 10 years. Adults with an unknown or incomplete history of completing a 3-dose immunization series with Td-containing vaccines should begin or complete a primary immunization series including a Tdap dose. Adults should receive a Td booster every 10 years.  Varicella vaccine. An adult without evidence of immunity to varicella should receive 2 doses or a second dose if he has previously received 1 dose.  Human papillomavirus (HPV) vaccine. Males aged 11-21 years who have not received the vaccine previously should receive the 3-dose series. Males aged 22-26 years may be immunized. Immunization is recommended through the age of 23 years for any male who has sex with males  and did not get any or all doses earlier. Immunization is recommended for any person with an immunocompromised condition through the age of 72 years if he did not get any or all doses earlier. During the 3-dose series, the second dose should be obtained 4-8 weeks after the first dose. The third dose should be obtained 24 weeks after the first dose and 16 weeks after the second dose.  Zoster vaccine. One dose is recommended for adults aged 23 years or older unless certain conditions are present.  Measles, mumps, and rubella (MMR) vaccine. Adults born before [birth year] generally are considered immune to measles and mumps. Adults born in [birth year]  or later should have 1 or more doses of MMR vaccine unless there is a contraindication to the vaccine or there is laboratory evidence of immunity to each of the three diseases. A routine second dose of MMR vaccine should be obtained at least 28 days after the first dose for students attending postsecondary schools, health care workers, or international travelers. People who received inactivated measles vaccine or an unknown type of measles vaccine during 1963-1967 should receive 2 doses of MMR vaccine. People who received inactivated mumps vaccine or an unknown type of mumps vaccine before 1979 and are at high risk for mumps infection should consider immunization with 2 doses of MMR vaccine. Unvaccinated health care workers born before [birth year] who lack laboratory evidence of measles, mumps, or rubella immunity or laboratory confirmation of disease should consider measles and mumps immunization with 2 doses of MMR vaccine or rubella immunization with 1 dose of MMR vaccine.  Pneumococcal 13-valent conjugate (PCV13) vaccine. When indicated, a person who is uncertain of his immunization history and has no record of immunization should receive the PCV13 vaccine. All adults 9 years of age and older should receive this vaccine. An adult aged 69 years or older who has certain medical  conditions and has not been previously immunized should receive 1 dose of PCV13 vaccine. This PCV13 should be followed with a dose of pneumococcal polysaccharide (PPSV23) vaccine. Adults who are at high risk for pneumococcal disease should obtain the PPSV23 vaccine at least 8 weeks after the dose of PCV13 vaccine. Adults older than 68 years of age who have normal immune system function should obtain the PPSV23 vaccine dose at least 1 year after the dose of PCV13 vaccine.  Pneumococcal polysaccharide (PPSV23) vaccine. When PCV13 is also indicated, PCV13 should be obtained first. All adults aged 79 years and older should be immunized. An adult younger than age 43 years who has certain medical conditions should be immunized. Any person who resides in a nursing home or long-term care facility should be immunized. An adult smoker should be immunized. People with an immunocompromised condition and certain other conditions should receive both PCV13 and PPSV23 vaccines. People with human immunodeficiency virus (HIV) infection should be immunized as soon as possible after diagnosis. Immunization during chemotherapy or radiation therapy should be avoided. Routine use of PPSV23 vaccine is not recommended for American Indians, [place] Natives, or people younger than 65 years unless there are medical conditions that require PPSV23 vaccine. When indicated, people who have unknown immunization and have no record of immunization should receive PPSV23 vaccine. One-time revaccination 5 years after the first dose of PPSV23 is recommended for people aged 19-64 years who have chronic kidney failure, nephrotic syndrome, asplenia, or immunocompromised conditions. People who received 1-2 doses of PPSV23 before age 70 years should receive another dose of PPSV23 vaccine at age 79 years or later if at least 5 years have passed since the previous dose. Doses of PPSV23 are not needed for people immunized with PPSV23 at or after age 55  years.  Meningococcal vaccine. Adults with asplenia or persistent complement component deficiencies should receive 2 doses of quadrivalent meningococcal conjugate (MenACWY-D) vaccine. The doses should be obtained at least 2 months apart. Microbiologists working with certain meningococcal bacteria, [employer] recruits, people at risk during an outbreak, and people who travel to or live in countries with a high rate of meningitis should be immunized. A first-year college student up through age 64 years who is living in a residence hall should receive a  dose if he did not receive a dose on or after his 16th birthday. Adults who have certain high-risk conditions should receive one or more doses of vaccine.  Hepatitis A vaccine. Adults who wish to be protected from this disease, have chronic liver disease, work with hepatitis A-infected animals, work in hepatitis A research labs, or travel to or work in countries with a high rate of hepatitis A should be immunized. Adults who were previously unvaccinated and who anticipate close contact with an international adoptee during the first 60 days after arrival in the [place] from a country with a high rate of hepatitis A should be immunized.  Hepatitis B vaccine. Adults should be immunized if they wish to be protected from this disease, are under age 34 years and have diabetes, have chronic liver disease, have had more than one sex partner in the past 6 months, may be exposed to blood or other infectious body fluids, are household contacts or sex partners of hepatitis B positive people, are clients or workers in certain care facilities, or travel to or work in countries with a high rate of hepatitis B.  Haemophilus influenzae type b (Hib) vaccine. A previously unvaccinated person with asplenia or sickle cell disease or having a scheduled splenectomy should receive 1 dose of Hib vaccine. Regardless of previous immunization, a recipient of a hematopoietic stem cell  transplant should receive a 3-dose series 6-12 months after his successful transplant. Hib vaccine is not recommended for adults with HIV infection. Preventive Service / Frequency Ages 77 to 55  Blood pressure check.** / Every 3-5 years.  Lipid and cholesterol check.** / Every 5 years beginning at age 66.  Hepatitis C blood test.** / For any individual with known risks for hepatitis C.  Skin self-exam. / Monthly.  Influenza vaccine. / Every year.  Tetanus, diphtheria, and acellular pertussis (Tdap, Td) vaccine.** / Consult your health care provider. 1 dose of Td every 10 years.  Varicella vaccine.** / Consult your health care provider.  HPV vaccine. / 3 doses over 6 months, if 45 or younger.  Measles, mumps, rubella (MMR) vaccine.** / You need at least 1 dose of MMR if you were born in 1957 or later. You may also need a second dose.  Pneumococcal 13-valent conjugate (PCV13) vaccine.** / Consult your health care provider.  Pneumococcal polysaccharide (PPSV23) vaccine.** / 1 to 2 doses if you smoke cigarettes or if you have certain conditions.  Meningococcal vaccine.** / 1 dose if you are age 81 to 79 years and a Market researcher living in a residence hall, or have one of several medical conditions. You may also need additional booster doses.  Hepatitis A vaccine.** / Consult your health care provider.  Hepatitis B vaccine.** / Consult your health care provider.  Haemophilus influenzae type b (Hib) vaccine.** / Consult your health care provider. Ages 6 to 58  Blood pressure check.** / Every year.  Lipid and cholesterol check.** / Every 5 years beginning at age 89.  Lung cancer screening. / Every year if you are aged 84-80 years and have a 30-pack-year history of smoking and currently smoke or have quit within the past 15 years. Yearly screening is stopped once you have quit smoking for at least 15 years or develop a health problem that would prevent you from having  lung cancer treatment.  Fecal occult blood test (FOBT) of stool. / Every year beginning at age 90 and continuing until age 73. You may not have to do  this test if you get a colonoscopy every 10 years.  Flexible sigmoidoscopy** or colonoscopy.** / Every 5 years for a flexible sigmoidoscopy or every 10 years for a colonoscopy beginning at age 50 and continuing until age 75.  Hepatitis C blood test.** / For all people born from 1945 through 1965 and any individual with known risks for hepatitis C.  Skin self-exam. / Monthly.  Influenza vaccine. / Every year.  Tetanus, diphtheria, and acellular pertussis (Tdap/Td) vaccine.** / Consult your health care provider. 1 dose of Td every 10 years.  Varicella vaccine.** / Consult your health care provider.  Zoster vaccine.** / 1 dose for adults aged 60 years or older.  Measles, mumps, rubella (MMR) vaccine.** / You need at least 1 dose of MMR if you were born in 1957 or later. You may also need a second dose.  Pneumococcal 13-valent conjugate (PCV13) vaccine.** / Consult your health care provider.  Pneumococcal polysaccharide (PPSV23) vaccine.** / 1 to 2 doses if you smoke cigarettes or if you have certain conditions.  Meningococcal vaccine.** / Consult your health care provider.  Hepatitis A vaccine.** / Consult your health care provider.  Hepatitis B vaccine.** / Consult your health care provider.  Haemophilus influenzae type b (Hib) vaccine.** / Consult your health care provider. Ages 65 and over  Blood pressure check.** / Every year.  Lipid and cholesterol check.**/ Every 5 years beginning at age 20.  Lung cancer screening. / Every year if you are aged 55-80 years and have a 30-pack-year history of smoking and currently smoke or have quit within the past 15 years. Yearly screening is stopped once you have quit smoking for at least 15 years or develop a health problem that would prevent you from having lung cancer treatment.  Fecal  occult blood test (FOBT) of stool. / Every year beginning at age 50 and continuing until age 75. You may not have to do this test if you get a colonoscopy every 10 years.  Flexible sigmoidoscopy** or colonoscopy.** / Every 5 years for a flexible sigmoidoscopy or every 10 years for a colonoscopy beginning at age 50 and continuing until age 75.  Hepatitis C blood test.** / For all people born from 1945 through 1965 and any individual with known risks for hepatitis C.  Abdominal aortic aneurysm (AAA) screening.** / A one-time screening for ages 65 to 75 years who are current or former smokers.  Skin self-exam. / Monthly.  Influenza vaccine. / Every year.  Tetanus, diphtheria, and acellular pertussis (Tdap/Td) vaccine.** / 1 dose of Td every 10 years.  Varicella vaccine.** / Consult your health care provider.  Zoster vaccine.** / 1 dose for adults aged 60 years or older.  Pneumococcal 13-valent conjugate (PCV13) vaccine.** / 1 dose for all adults aged 65 years and older.  Pneumococcal polysaccharide (PPSV23) vaccine.** / 1 dose for all adults aged 65 years and older.  Meningococcal vaccine.** / Consult your health care provider.  Hepatitis A vaccine.** / Consult your health care provider.  Hepatitis B vaccine.** / Consult your health care provider.  Haemophilus influenzae type b (Hib) vaccine.** / Consult your health care provider. **Family history and personal history of risk and conditions may change your health care provider's recommendations.   This information is not intended to replace advice given to you by your health care provider. Make sure you discuss any questions you have with your health care provider.   Document Released: 03/24/2001 Document Revised: 02/16/2014 Document Reviewed: 06/23/2010 Elsevier Interactive Patient Education 2016   [place] refers to food and lifestyle choices that are based on the traditions of  countries located on the [employer]. This way of eating has been shown to help prevent certain conditions and improve outcomes for people who have chronic diseases, like kidney disease and heart disease. What are tips for following this plan? Lifestyle  Cook and eat meals together with your family, when possible.  Drink enough fluid to keep your urine clear or pale yellow.  Be physically active every day. This includes: ? Aerobic exercise like running or swimming. ? Leisure activities like gardening, walking, or housework.  Get 7-8 hours of sleep each night.  If recommended by your health care provider, drink red wine in moderation. This means 1 glass a day for nonpregnant women and 2 glasses a day for men. A glass of wine equals 5 oz (150 mL). Reading food labels  Check the serving size of packaged foods. For foods such as rice and pasta, the serving size refers to the amount of cooked product, not dry.  Check the total fat in packaged foods. Avoid foods that have saturated fat or trans fats.  Check the ingredients list for added sugars, such as corn syrup. Shopping  At the grocery store, buy most of your food from the areas near the walls of the store. This includes: ? Fresh fruits and vegetables (produce). ? Grains, beans, nuts, and seeds. Some of these may be available in unpackaged forms or large amounts (in bulk). ? Fresh seafood. ? Poultry and eggs. ? Low-fat dairy products.  Buy whole ingredients instead of prepackaged foods.  Buy fresh fruits and vegetables in-season from local farmers markets.  Buy frozen fruits and vegetables in resealable bags.  If you do not have access to quality fresh seafood, buy precooked frozen shrimp or canned fish, such as tuna, salmon, or sardines.  Buy small amounts of raw or cooked vegetables, salads, or olives from the deli or salad bar at your store.  Stock your pantry so you always have certain foods on hand, such as olive  oil, canned tuna, canned tomatoes, rice, pasta, and beans. Cooking  Cook foods with extra-virgin olive oil instead of using butter or other vegetable oils.  Have meat as a side dish, and have vegetables or grains as your main dish. This means having meat in small portions or adding small amounts of meat to foods like pasta or stew.  Use beans or vegetables instead of meat in common dishes like chili or lasagna.  Experiment with different cooking methods. Try roasting or broiling vegetables instead of steaming or sauteing them.  Add frozen vegetables to soups, stews, pasta, or rice.  Add nuts or seeds for added healthy fat at each meal. You can add these to yogurt, salads, or vegetable dishes.  Marinate fish or vegetables using olive oil, lemon juice, garlic, and fresh herbs. Meal planning  Plan to eat 1 vegetarian meal one day each week. Try to work up to 2 vegetarian meals, if possible.  Eat seafood 2 or more times a week.  Have healthy snacks readily available, such as: ? Vegetable sticks with hummus. ? [place] yogurt. ? Fruit and nut trail mix.  Eat balanced meals throughout the week. This includes: ? Fruit: 2-3 servings a day ? Vegetables: 4-5 servings a day ? Low-fat dairy: 2 servings a day ? Fish, poultry, or lean meat: 1 serving a day ? Beans and legumes: 2 or more  servings a week ? Nuts and seeds: 1-2 servings a day ? Whole grains: 6-8 servings a day ? Extra-virgin olive oil: 3-4 servings a day  Limit red meat and sweets to only a few servings a month What are my food choices?  Mediterranean diet ? Recommended ? Grains: Whole-grain pasta. Brown rice. Bulgar wheat. Polenta. Couscous. Whole-wheat bread. [place]. ? Vegetables: Artichokes. Beets. Broccoli. Cabbage. Carrots. Eggplant. Green beans. Chard. Kale. Spinach. Onions. Leeks. Peas. Squash. Tomatoes. Peppers. Radishes. ? Fruits: Apples. Apricots. Avocado. Berries. Bananas. Cherries. Dates. Figs. Grapes.  Lemons. Melon. Oranges. Peaches. Plums. Pomegranate. ? Meats and other protein foods: Beans. Almonds. Sunflower seeds. Pine nuts. Peanuts. [place]. Salmon. Scallops. Shrimp. [place]. Tilapia. Clams. Oysters. Eggs. ? Dairy: Low-fat milk. Cheese. Greek yogurt. ? Beverages: Water. Red wine. Herbal tea. ? Fats and oils: Extra virgin olive oil. Avocado oil. Grape seed oil. ? Sweets and desserts: [place] yogurt with honey. Baked apples. Poached pears. Trail mix. ? Seasoning and other foods: Basil. Cilantro. Coriander. Cumin. Mint. Parsley. Sage. Rosemary. Tarragon. Garlic. Oregano. Thyme. Pepper. Balsalmic vinegar. Tahini. Hummus. Tomato sauce. Olives. Mushrooms. ? Limit these ? Grains: Prepackaged pasta or rice dishes. Prepackaged cereal with added sugar. ? Vegetables: Deep fried potatoes (french fries). ? Fruits: Fruit canned in syrup. ? Meats and other protein foods: Beef. Pork. Lamb. Poultry with skin. Hot dogs. [place]. ? Dairy: Ice cream. Sour cream. Whole milk. ? Beverages: Juice. Sugar-sweetened soft drinks. Beer. Liquor and spirits. ? Fats and oils: Butter. Canola oil. Vegetable oil. Beef fat (tallow). Lard. ? Sweets and desserts: Cookies. Cakes. Pies. Candy. ? Seasoning and other foods: Mayonnaise. Premade sauces and marinades. ? The items listed may not be a complete list. Talk with your dietitian about what dietary choices are right for you. Summary  The Mediterranean diet includes both food and lifestyle choices.  Eat a variety of fresh fruits and vegetables, beans, nuts, seeds, and whole grains.  Limit the amount of red meat and sweets that you eat.  Talk with your health care provider about whether it is safe for you to drink red wine in moderation. This means 1 glass a day for nonpregnant women and 2 glasses a day for men. A glass of wine equals 5 oz (150 mL). This information is not intended to replace advice given to you by your health care provider. Make sure you discuss any questions  you have with your health care provider. Document Released: 09/19/2015 Document Revised: 10/22/2015 Document Reviewed: 09/19/2015 Elsevier Interactive Patient Education  2018 [place] all medications as directed. Stay well hydrated with water and follow Mediterranean diet. Continue to walk as often as possible. Continue to check blood pressure and heart rate at home. If you have having consistently elevated readings (>150/90) or low readings (<100/60), please call your Cardiologist. Please return in 4 weeks for lab appt- liver function panel since we increased your Atorvastatin (Lipitor). Please return in 5 months for regular follow and fasting labs. NICE TO SEE YOU!

## 2017-10-20 NOTE — Assessment & Plan Note (Signed)
Continue all medications as directed. Stay well hydrated with water and follow Mediterranean diet. Continue to walk as often as possible. Continue to check blood pressure and heart rate at home. If you have having consistently elevated readings (>150/90) or low readings (<100/60), please call your Cardiologist. Please return in 4 weeks for lab appt- liver function panel since we increased your Atorvastatin (Lipitor). Please return in 5 months for regular follow and fasting labs.

## 2017-10-20 NOTE — Assessment & Plan Note (Signed)
BP Per recent cards visit: Lisinopril increased from 20mg  to 40mg  He was instructed that if home SBP was >140 to take full tablet of bisoprolol 5mg , if SBP <140 then only 1/2 tab of bisoprolol 5mg  He reports home SBP have been consistently running 130 He denies CP/dyspnea/dizzness/HA/palpitations

## 2017-10-20 NOTE — Assessment & Plan Note (Signed)
>>  ASSESSMENT AND PLAN FOR HIGH CHOLESTEROL WRITTEN ON 10/20/2017  8:50 AM BY DANFORD, KATY D, NP  The 10-year ASCVD risk score Denman George DC Jr., et al., 2013) is: 25.9%   Values used to calculate the score:     Age: 67 years     Sex: Male     Is Non-Hispanic African American: No     Diabetic: No     Tobacco smoker: No     Systolic Blood Pressure: 167 mmHg     Is BP treated: Yes     HDL Cholesterol: 44 mg/dL     Total Cholesterol: 178 mg/dL  [id number] Atorvastatin increased from 10mg  to 20mg  Will recheck LFTS in 4 weeks, lipids 5 months Follow Mediterranean diet, move as much as possible.

## 2017-11-17 ENCOUNTER — Other Ambulatory Visit (INDEPENDENT_AMBULATORY_CARE_PROVIDER_SITE_OTHER): Payer: Medicare Other

## 2017-11-17 DIAGNOSIS — D72829 Elevated white blood cell count, unspecified: Secondary | ICD-10-CM | POA: Diagnosis not present

## 2017-11-18 LAB — CBC WITH DIFFERENTIAL/PLATELET
BASOS ABS: 0 10*3/uL (ref 0.0–0.2)
BASOS: 0 %
EOS (ABSOLUTE): 0.3 10*3/uL (ref 0.0–0.4)
Eos: 3 %
HEMOGLOBIN: 17.3 g/dL (ref 13.0–17.7)
Hematocrit: 50.9 % (ref 37.5–51.0)
IMMATURE GRANS (ABS): 0 10*3/uL (ref 0.0–0.1)
Immature Granulocytes: 0 %
LYMPHS ABS: 1.7 10*3/uL (ref 0.7–3.1)
LYMPHS: 18 %
MCH: 27.9 pg (ref 26.6–33.0)
MCHC: 34 g/dL (ref 31.5–35.7)
MCV: 82 fL (ref 79–97)
MONOCYTES: 4 %
Monocytes Absolute: 0.4 10*3/uL (ref 0.1–0.9)
NEUTROS ABS: 7.4 10*3/uL — AB (ref 1.4–7.0)
Neutrophils: 75 %
Platelets: 391 10*3/uL (ref 150–450)
RBC: 6.2 x10E6/uL — ABNORMAL HIGH (ref 4.14–5.80)
RDW: 12 % — ABNORMAL LOW (ref 12.3–15.4)
WBC: 9.8 10*3/uL (ref 3.4–10.8)

## 2017-11-25 ENCOUNTER — Ambulatory Visit
Admission: RE | Admit: 2017-11-25 | Discharge: 2017-11-25 | Disposition: A | Discharge status: Home / Self Care | Payer: Medicare Other | Source: Ambulatory Visit | Attending: Adult Health | Admitting: Adult Health

## 2017-11-25 DIAGNOSIS — Z87891 Personal history of nicotine dependence: Secondary | ICD-10-CM | POA: Diagnosis not present

## 2017-11-25 DIAGNOSIS — Z122 Encounter for screening for malignant neoplasm of respiratory organs: Secondary | ICD-10-CM

## 2017-11-29 ENCOUNTER — Encounter: Payer: Self-pay | Admitting: Adult Health

## 2017-11-29 ENCOUNTER — Ambulatory Visit (INDEPENDENT_AMBULATORY_CARE_PROVIDER_SITE_OTHER): Payer: Medicare Other | Admitting: Adult Health

## 2017-11-29 VITALS — BP 161/84 | HR 90 | Ht 69.0 in | Wt 206.8 lb

## 2017-11-29 DIAGNOSIS — R911 Solitary pulmonary nodule: Secondary | ICD-10-CM | POA: Diagnosis not present

## 2017-11-29 DIAGNOSIS — Z0000 Encounter for general adult medical examination without abnormal findings: Secondary | ICD-10-CM

## 2017-11-29 DIAGNOSIS — I10 Essential (primary) hypertension: Secondary | ICD-10-CM

## 2017-11-29 DIAGNOSIS — E7800 Pure hypercholesterolemia, unspecified: Secondary | ICD-10-CM

## 2017-11-29 DIAGNOSIS — Z79899 Other long term (current) drug therapy: Secondary | ICD-10-CM

## 2017-11-29 NOTE — Patient Instructions (Addendum)
Pulmonary Nodule A pulmonary nodule is a small, round growth of tissue in the lung. Pulmonary nodules can range in size from less than 1/5 inch (4 mm) to a little bigger than an inch (25 mm). Most pulmonary nodules are detected when imaging tests of the lung are being performed for a different problem. Pulmonary nodules are usually not cancerous (benign). However, some pulmonary nodules are cancerous (malignant). Follow-up treatment or testing is based on the size of the pulmonary nodule and your risk of getting lung cancer. What are the causes? Benign pulmonary nodules can be caused by various things. Some of the causes include:  Bacterial, fungal, or viral infections. This is usually an old infection that is no longer active, but it can sometimes be a current, active infection.  A benign mass of tissue.  Inflammation from conditions such as rheumatoid arthritis.  Abnormal blood vessels in the lungs.  Malignant pulmonary nodules can result from lung cancer or from cancers that spread to the lung from other places in the body. What are the signs or symptoms? Pulmonary nodules usually do not cause symptoms. How is this diagnosed? Most often, pulmonary nodules are found incidentally when an X-ray or CT scan is performed to look for some other problem in the lung area. To help determine whether a pulmonary nodule is benign or malignant, your health care provider will take a medical history and order a variety of tests. Tests done may include:  Blood tests.  A skin test called a tuberculin test. This test is used to determine if you have been exposed to the germ that causes tuberculosis.  Chest X-rays. If possible, a new X-ray may be compared with X-rays you have had in the past.  CT scan. This test shows smaller pulmonary nodules more clearly than an X-ray.  Positron emission tomography (PET) scan. In this test, a safe amount of a radioactive substance is injected into the bloodstream. Then,  the scan takes a picture of the pulmonary nodule. The radioactive substance is eliminated from your body in your urine.  Biopsy. A tiny piece of the pulmonary nodule is removed so it can be checked under a microscope.  How is this treated? Pulmonary nodules that are benign normally do not require any treatment because they usually do not cause symptoms or breathing problems. Your health care provider may want to monitor the pulmonary nodule through follow-up CT scans. The frequency of these CT scans will vary based on the size of the nodule and the risk factors for lung cancer. For example, CT scans will need to be done more frequently if the pulmonary nodule is larger and if you have a history of smoking and a family history of cancer. Further testing or biopsies may be done if any follow-up CT scan shows that the size of the pulmonary nodule has increased. Follow these instructions at home:  Only take over-the-counter or prescription medicines as directed by your health care provider.  Keep all follow-up appointments with your health care provider. Contact a health care provider if:  You have trouble breathing when you are active.  You feel sick or unusually tired.  You do not feel like eating.  You lose weight without trying to.  You develop chills or night sweats. Get help right away if:  You cannot catch your breath, or you begin wheezing.  You cannot stop coughing.  You cough up blood.  You become dizzy or feel like you are going to pass out.  You  have sudden chest pain.  You have a fever or persistent symptoms for more than 2-3 days.  You have a fever and your symptoms suddenly get worse. This information is not intended to replace advice given to you by your health care provider. Make sure you discuss any questions you have with your health care provider. Document Released: 11/23/2008 Document Revised: 07/04/2015 Document Reviewed: 07/18/2012 Elsevier Interactive  Patient Education  2017 [employer]  Copy of CT report provided. Referral to Pulmonology placed to further evaluate Right Upper Lobe nodule and order additional imaging. Continue to abstain from tobacco use. Copy of report will be sent to [facility]. We will call you when lab results are available. Please keep follow-up appt in Jan 2020. NICE TO SEE YOU!

## 2017-11-29 NOTE — Assessment & Plan Note (Addendum)
Both BP readings above goal, most likely r/t to being brought in to discuss findings on LDCT Currently on Lisinopril 40mg  QD Denies acute cardiac sx's

## 2017-11-29 NOTE — Assessment & Plan Note (Signed)
Copy of CT report provided. Referral to Pulmonology placed to further evaluate Right Upper Lobe nodule and order additional imaging. Continue to abstain from tobacco use. Copy of report will be sent to [facility]. We will call you when lab results are available. Please keep follow-up appt in Jan 2020.

## 2017-11-29 NOTE — Assessment & Plan Note (Signed)
Sept 2019 Atorvastatin increased from 10mg  to 20mg  Hepatic panel drawn today

## 2017-11-29 NOTE — Progress Notes (Signed)
Subjective:    Patient ID: William Mouton Sr., male    DOB: 13-Jan-1951, 67 y.o.   MRN: [id number]  HPI:  William Hogan is here to discuss results of recent LDCT that was completed 11/25/17- CLINICAL DATA:  67 year old male former smoker (quit in July 2018) with 45 pack-year history of smoking. Lung cancer screening examination. IMPRESSION: 1. 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 Follow up low-dose chest CT without contrast in 3 months (please use the following order, "CT CHEST LCS NODULE FOLLOW-UP W/O CM") is recommended.  Alternatively, PET may be considered when there is a solid component 8mm or larger. 2.Three vessel CAD  3. COPD 4. Calcifications of the aortic valve and mitral annulus. Echocardiographic correlation for evaluation of potential valvular dysfunction may be warranted if clinically indicated.  A copy of report will [facility] to [facility]  Of Note 09/05/16 Hospital Encounter for Near Syncope CXR-FINDINGS: The cardiac silhouette is borderline enlarged. Aortic atherosclerosis is noted. A 13 mm nodule projects in the right upper lobe. No confluent airspace opacity, edema, pneumothorax, or definite pleural effusion is identified. The posterior costophrenic angles were incompletely imaged. Thoracic spondylosis is noted.  IMPRESSION: 1. No evidence of acute cardiopulmonary process. 2. 13 mm right upper lobe nodule. Nonurgent chest CT is recommended. 3.  Aortic Atherosclerosis (ICD10-I70.0).  He never followed up with PCP/Cardiology/Pulmonolgy   He continues to abstain from tobacco use and has been trying to follow heart healthy diet. He denies CP/dyspnea/dizziness/HA/palpitations. He reports medication compliance, denies SE  Patient Care Team    Relationship Specialty Notifications Start End  Julaine Fusi, NP PCP - General Family Medicine  09/29/17   Elder Negus, MD Consulting Physician Cardiology  09/29/17   Lanelle Bal, MD Resident Internal  Medicine  09/29/17     Patient Active Problem List   Diagnosis Date Noted  . Nodule of right lung 11/29/2017  . Healthcare maintenance 10/20/2017  . High cholesterol 09/29/2017  . Vertigo 09/29/2017  . Hyponatremia 09/06/2016  . Essential hypertension 09/06/2016  . Dyspnea on exertion 09/06/2016  . Near syncope 09/05/2016  . Postural dizziness with presyncope 09/05/2016     Past Medical History:  Diagnosis Date  . High cholesterol   . Hypertension      History reviewed. No pertinent surgical history.   History reviewed. No pertinent family history.   Social History   Substance and Sexual Activity  Drug Use No     Social History   Substance and Sexual Activity  Alcohol Use No   Comment: Quit drinking alcohol 10/20/2010     Social History   Tobacco Use  Smoking Status Former Smoker  . Packs/day: 1.50  . Years: 40.00  . Pack years: 60.00  . Types: Cigarettes  Smokeless Tobacco Never Used     Outpatient Encounter Medications as of 11/29/2017  Medication Sig  . atorvastatin (LIPITOR) 20 MG tablet Take 1 tablet (20 mg total) by mouth daily.  . bisoprolol (ZEBETA) 5 MG tablet Take 2.5 mg by mouth daily.  Marland Kitchen lisinopril (PRINIVIL,ZESTRIL) 40 MG tablet Take 40 mg by mouth daily.  . meclizine (ANTIVERT) 25 MG tablet Take 1 tablet (25 mg total) by mouth 3 (three) times daily as needed for dizziness.   No facility-administered encounter medications on file as of 11/29/2017.     Allergies: Clonidine derivatives  Body mass index is 30.54 kg/m.  Blood pressure (!) 161/84, pulse 90, height 5\' 9"  (1.753 m), weight 206 lb 12.8 oz (93.8 kg), SpO2 95 %.  Review of Systems  Constitutional: Positive for fatigue. Negative for activity change, appetite change, chills, diaphoresis, fever and unexpected weight change.  HENT: Negative for congestion.   Eyes: Negative for visual disturbance.  Respiratory: Negative for cough, chest tightness, shortness of breath,  wheezing and stridor.   Cardiovascular: Negative for chest pain, palpitations and leg swelling.  Neurological: Negative for dizziness and headaches.  Hematological: Does not bruise/bleed easily.       Objective:   Physical Exam  Constitutional: He is oriented to person, place, and time. He appears well-developed and well-nourished. No distress.  HENT:  Head: Normocephalic and atraumatic.  Right Ear: External ear normal. Decreased hearing is noted.  Left Ear: External ear normal. Decreased hearing is noted.  Nose: Nose normal.  Mouth/Throat: Oropharynx is clear and moist.  Eyes: Pupils are equal, round, and reactive to light. Conjunctivae and EOM are normal.  Cardiovascular: Normal rate, regular rhythm, normal heart sounds and intact distal pulses.  No murmur heard. Pulmonary/Chest: Effort normal and breath sounds normal. No stridor. No respiratory distress. He has no wheezes. He has no rales. He exhibits no tenderness.  Neurological: He is alert and oriented to person, place, and time.  Skin: Skin is warm and dry. Capillary refill takes less than 2 seconds. No rash noted. He is not diaphoretic. No erythema. No pallor.  Psychiatric: He has a normal mood and affect. His behavior is normal. Judgment and thought content normal.  Nursing note and vitals reviewed.     Assessment & Plan:   1. Nodule of right lung   2. Long-term use of high-risk medication   3. Essential hypertension   4. High cholesterol   5. Healthcare maintenance     Nodule of right lung Copy of CT report provided. Referral to Pulmonology placed to further evaluate Right Upper Lobe nodule and order additional imaging. Continue to abstain from tobacco use. Copy of report will be sent to [facility]. We will call you when lab results are available. Please keep follow-up appt in Jan 2020.  Essential hypertension Both BP readings above goal, most likely r/t to being brought in to discuss findings on  LDCT Currently on Lisinopril 40mg  QD Denies acute cardiac sx's  Healthcare maintenance Sept 2019 Atorvastatin increased from 10mg  to 20mg  Hepatic panel drawn today     FOLLOW-UP:  Return in about 3 months (around 03/01/2018) for Regular Follow Up.

## 2017-11-30 LAB — HEPATIC FUNCTION PANEL
ALBUMIN: 4.3 g/dL (ref 3.6–4.8)
ALT: 26 IU/L (ref 0–44)
AST: 22 IU/L (ref 0–40)
Alkaline Phosphatase: 114 IU/L (ref 39–117)
BILIRUBIN TOTAL: 0.5 mg/dL (ref 0.0–1.2)
BILIRUBIN, DIRECT: 0.15 mg/dL (ref 0.00–0.40)
TOTAL PROTEIN: 7.1 g/dL (ref 6.0–8.5)

## 2017-12-08 ENCOUNTER — Encounter: Payer: Self-pay | Admitting: Internal Medicine

## 2017-12-08 ENCOUNTER — Ambulatory Visit (INDEPENDENT_AMBULATORY_CARE_PROVIDER_SITE_OTHER): Payer: Medicare Other | Admitting: Internal Medicine

## 2017-12-08 VITALS — BP 132/86 | HR 90 | Ht 69.0 in | Wt 205.0 lb

## 2017-12-08 DIAGNOSIS — I10 Essential (primary) hypertension: Secondary | ICD-10-CM | POA: Diagnosis not present

## 2017-12-08 DIAGNOSIS — R0609 Other forms of dyspnea: Secondary | ICD-10-CM | POA: Diagnosis not present

## 2017-12-08 DIAGNOSIS — R911 Solitary pulmonary nodule: Secondary | ICD-10-CM | POA: Diagnosis not present

## 2017-12-08 NOTE — Progress Notes (Signed)
William Mouton Sr., male    DOB: 15-Aug-1950,    MRN: [id number]    Brief patient profile:  71  yowm quit smoking 08/2016 p syncope episode > ER where cxr showed  13 mm RUL nodule which was confirmed by LDSCT  11/26/17.   History of Present Illness  12/08/2017  f/u ov/William Hogan re:  Chief Complaint  Patient presents with  . Pulmonary Consult    Referred by William Hamburger, NP for eval of pulmonary nodule.  Pt had CT chest done 11/26/17. Pt c/o occ DOE started about 2 years ago.   Dyspnea:  Can no longer push mower x  sev years and hauling groceries results in more doe than prior MMRC1 = can walk nl pace, flat grade, can't hurry or go uphills or steps s sob   Cough: minimal qod sometime related to meals "something gets stuck" Sleep: able to lie flat on back/one  SABA use: none   No obvious day to day or daytime variability or assoc excess/ purulent sputum or mucus plugs or hemoptysis or cp or chest tightness, subjective wheeze or overt sinus or hb symptoms.   Sleeping ok  without nocturnal  or early am exacerbation  of respiratory  c/o's or need for noct saba. Also denies any obvious fluctuation of symptoms with weather or environmental changes or other aggravating or alleviating factors except as outlined above   No unusual exposure hx or h/o childhood pna/ asthma or knowledge of premature birth.  Current Allergies, Complete Past Medical History, Past Surgical History, Family History, and Social History were reviewed in [employer] record.  ROS  The following are not active complaints unless bolded Hoarseness, sore throat, dysphagia, dental problems, itching, sneezing,  nasal congestion or discharge of excess mucus or purulent secretions, ear ache,   fever, chills, sweats, unintended wt loss or wt gain, classically pleuritic or exertional cp,  orthopnea pnd or arm/hand swelling  or leg swelling, presyncope, palpitations, abdominal pain, anorexia, nausea, vomiting,  diarrhea  or change in bowel habits or change in bladder habits, change in stools or change in urine, dysuria, hematuria,  rash, arthralgias, visual complaints, headache, numbness, weakness or ataxia or problems with walking or coordination,  change in mood or  memory.           Past Medical History:  Diagnosis Date  . High cholesterol   . Hypertension     Outpatient Medications Prior to Visit  Medication Sig Dispense Refill  . atorvastatin (LIPITOR) 20 MG tablet Take 1 tablet (20 mg total) by mouth daily. 90 tablet 1  . bisoprolol (ZEBETA) 5 MG tablet Take 2.5 mg by mouth daily.    Marland Kitchen lisinopril (PRINIVIL,ZESTRIL) 40 MG tablet Take 40 mg by mouth daily.    . meclizine (ANTIVERT) 25 MG tablet Take 1 tablet (25 mg total) by mouth 3 (three) times daily as needed for dizziness. 30 tablet 0             Objective:    amb pleasant wm mild voice fatigue   BP 132/86 (BP Location: Left Arm, Cuff Size: Normal)   Pulse 90   Ht 5\' 9"  (1.753 m)   Wt 205 lb (93 kg)   SpO2 94%   BMI 30.27 kg/m   SpO2: 94 % RA   HEENT: Poor dentition/ nl oropharynx. Nl external ear canals without cough reflex -  Mild bilateral non-specific turbinate edema     NECK :  without JVD/Nodes/TM/ nl carotid  upstrokes bilaterally   LUNGS: no acc muscle use,  Mild barrel  contour chest wall with bilateral  Distant bs s audible wheeze and  without cough on insp or exp maneuver and mild  Hyperresonant  to  percussion bilaterally     CV:  RRR  no s3 or murmur or increase in P2, and no edema   ABD:  soft and nontender with pos end insp Hoover's  in the supine position. No bruits or organomegaly appreciated, bowel sounds nl  MS:   Nl gait/  ext warm without deformities, calf tenderness, cyanosis or clubbing No obvious joint restrictions   SKIN: warm and dry without lesions    NEURO:  alert, approp, nl sensorium with  no motor or cerebellar deficits apparent.       CT chest 11/25/17 In the right upper  lobe there is a macrolobulated nodule (axial image 90 of series 3), with a volume derived mean diameter of 10.8 mm. No acute consolidative airspace disease. No pleural effusions. Mild diffuse bronchial wall thickening with mild centrilobular and paraseptal emphysema.  My review  No change since first detected 09/06/16   Addendum review by Dr Llana Aliment 12/13/17   Comparison with prior chest x-rays was requested. Direct comparison between modalities is not considered very accurate. However, within these limitations, this nodule appears roughly stable between the low-dose lung cancer screening chest CT and prior chest x-ray dating back to 09/06/2016. Assessment remains as a Lung-RADS 4AS lesion. Follow up low-dose chest CT without contrast in 3 months (please use the following order, "CT CHEST LCS NODULE FOLLOW-UP W/O CM") is recommended. Given the stability compared to the prior study, PET-CT is unlikely to be warranted at this time.     Assessment   Dyspnea on exertion 12/08/2017  Walked RA x 3 laps @ 185 ft each stopped due to  End of study, nl to fast pace, no sob or desat    - Spirometry 12/08/2017  FEV1 2.3 (71%)  Ratio 71 s significant curvature s anything prior    No evidence of significant copd at this point or indication for resp rx at all.  rec keep in mind in event of worsening symptoms suggesting copd in the future:   When respiratory symptoms begin or become refractory well after a patient reports complete smoking cessation,  Especially when this wasn't the case while they were smoking, a red flag is raised based on the work of Dr Primitivo Gauze which states:  if you quit smoking when your best day FEV1 is still well preserved it is highly unlikely you will progress to severe disease.  That is to say, once the smoking stops,  the symptoms should not suddenly erupt or markedly worsen.  If so, the differential diagnosis should include  obesity/deconditioning,  LPR/Reflux/Aspiration  syndromes,  occult CHF, or  especially side effect of medications commonly used in this population eg ACEi which may be contributing to his sense of pnds  (see hbp)   >>> rec trial off acei if sob or cough worsen prior to repeating pulmonary eval       Solitary pulmonary nodule -  ? hamartoma?  1st seen on plain cxr 09/06/17 -  See CT chest 11/25/17 > rec f/u 6 m LDSCT  Placed in tickle file   This lesion has benign features ? Hamartoma ? But agree needs f/u CT the only question is how often and how long to continue the f/u but since he is only 1 year out  from smoking the guidelines suggest many years (? 14?) of f/u needed so   >>>rec one extra ct in 6 months then resume f/u per guidelines (the extra 3 m in this case based on prior cxr results though agree the comparison is not apples to apples)   Discussed in detail all the  indications, usual  risks and alternatives  relative to the benefits with patient who agrees to proceed with conservative f/u as outlined       Essential hypertension ACE inhibitors are problematic in  pts with airway complaints because  even experienced pulmonologists can't always distinguish ace effects from copd/asthma.  By themselves they don't actually cause a problem, much like oxygen can't by itself start a fire, but they certainly serve as a powerful catalyst or enhancer for any "fire"  or inflammatory process in the upper airway, be it caused by an ET  tube or more commonly reflux (especially in the obese or pts with known GERD or who are on biphoshonates).    >>> In the era of ARB near equivalency would consider replacing ACEi on trial basis if sob or cough worsen off cigs (see doe discussion) before repeating pulmonary eval/ reviewed with pt in detail    Total time devoted to counseling  > 50 % of initial 60 min office visit:  review case with pt/ discussion of options/alternatives/ personally creating written customized instructions  in presence of pt  then  going over those specific  Instructions directly with the pt including how to use all of the meds but in particular covering each new medication in detail and the difference between the maintenance= "automatic" meds and the prns using an action plan format for the latter (If this problem/symptom => do that organization reading Left to right).  Please see AVS from this visit for a full list of these instructions which I personally wrote for this pt and  are unique to this visit.         Sandrea Hughs, MD 12/08/2017

## 2017-12-08 NOTE — Assessment & Plan Note (Addendum)
12/08/2017  Walked RA x 3 laps @ 185 ft each stopped due to  End of study, nl to fast pace, no sob or desat    - Spirometry 12/08/2017  FEV1 2.3 (71%)  Ratio 71 s significant curvature s anything prior    No evidence of significant copd at this point or indication for resp rx at all.  rec keep in mind in event of worsening symptoms suggesting copd in the future:   When respiratory symptoms begin or become refractory well after a patient reports complete smoking cessation,  Especially when this wasn't the case while they were smoking, a red flag is raised based on the work of Dr Primitivo Gauze which states:  if you quit smoking when your best day FEV1 is still well preserved it is highly unlikely you will progress to severe disease.  That is to say, once the smoking stops,  the symptoms should not suddenly erupt or markedly worsen.  If so, the differential diagnosis should include  obesity/deconditioning,  LPR/Reflux/Aspiration syndromes,  occult CHF, or  especially side effect of medications commonly used in this population eg ACEi which may be contributing to his sense of pnds  (see hbp)   >>> rec trial off acei if sob or cough worsen prior to repeating pulmonary eval

## 2017-12-08 NOTE — Patient Instructions (Addendum)
Your lisinopril is the likely cause of your symptoms at present but this does not appear to be severe enough to warrant immediate replacement so I'm going to defer this to your PCP  I will contact you for a final recommendation on when the next CT chest is needed base on the current guidelines but it will be well after the first of the year   Follow up in this office will be as needed for new respiratory symptoms  - worse cough, short of breath, pain with breathing  Add:  Radiology rec 3 m f/u but fine with me to do 6 m LDSCT

## 2017-12-14 ENCOUNTER — Encounter: Payer: Self-pay | Admitting: Internal Medicine

## 2017-12-14 NOTE — Assessment & Plan Note (Signed)
>>  ASSESSMENT AND PLAN FOR SOLITARY PULMONARY NODULE -  ? HAMARTOMA?  WRITTEN ON 12/14/2017  8:52 AM BY Sherene Sires, Summer B, MD  1st seen on plain cxr 09/06/17 -  See CT chest 11/25/17 > rec f/u 6 m LDSCT  Placed in tickle file   This lesion has benign features ? Hamartoma ? But agree needs f/u CT the only question is how often and how long to continue the f/u but since he is only 1 year out from smoking the guidelines suggest many years (? 14?) of f/u needed so   >>>rec one extra ct in 6 months then resume f/u per guidelines (the extra 3 m in this case based on prior cxr results though agree the comparison is not apples to apples)   Discussed in detail all the  indications, usual  risks and alternatives  relative to the benefits with patient who agrees to proceed with conservative f/u as outlined

## 2017-12-14 NOTE — Assessment & Plan Note (Signed)
ACE inhibitors are problematic in  pts with airway complaints because  even experienced pulmonologists can't always distinguish ace effects from copd/asthma.  By themselves they don't actually cause a problem, much like oxygen can't by itself start a fire, but they certainly serve as a powerful catalyst or enhancer for any "fire"  or inflammatory process in the upper airway, be it caused by an ET  tube or more commonly reflux (especially in the obese or pts with known GERD or who are on biphoshonates).    >>> In the era of ARB near equivalency would consider replacing ACEi on trial basis if sob or cough worsen off cigs (see doe discussion) before repeating pulmonary eval/ reviewed with pt in detail    Total time devoted to counseling  > 50 % of initial 60 min office visit:  review case with pt/ discussion of options/alternatives/ personally creating written customized instructions  in presence of pt  then going over those specific  Instructions directly with the pt including how to use all of the meds but in particular covering each new medication in detail and the difference between the maintenance= "automatic" meds and the prns using an action plan format for the latter (If this problem/symptom => do that organization reading Left to right).  Please see AVS from this visit for a full list of these instructions which I personally wrote for this pt and  are unique to this visit.

## 2017-12-14 NOTE — Assessment & Plan Note (Addendum)
1st seen on plain cxr 09/06/17 -  See CT chest 11/25/17 > rec f/u 6 m LDSCT  Placed in tickle file   This lesion has benign features ? Hamartoma ? But agree needs f/u CT the only question is how often and how long to continue the f/u but since he is only 1 year out from smoking the guidelines suggest many years (? 14?) of f/u needed so   >>>rec one extra ct in 6 months then resume f/u per guidelines (the extra 3 m in this case based on prior cxr results though agree the comparison is not apples to apples)   Discussed in detail all the  indications, usual  risks and alternatives  relative to the benefits with patient who agrees to proceed with conservative f/u as outlined

## 2017-12-15 ENCOUNTER — Telehealth: Payer: Self-pay | Admitting: *Deleted

## 2017-12-15 NOTE — Telephone Encounter (Signed)
Spoke with the pt and notified of recs per MW  He verbalized understanding  Nothing further needed  

## 2017-12-15 NOTE — Telephone Encounter (Signed)
-----   Message from Nyoka Cowden, MD sent at 12/14/2017  8:57 AM EST ----- Let pt know I got two opinions on when to do f/u based on new info that he had prior cxr to compare:  Answer is 3 -6 months and I favor 6 months based on all the info I have now so put him on the reminder list for 05/2018

## 2018-02-14 ENCOUNTER — Other Ambulatory Visit: Payer: Self-pay | Admitting: Adult Health

## 2018-02-14 NOTE — Telephone Encounter (Signed)
He is followed by [facility] Please tell pt to request rx from that practice Thanks! Orpha Bur

## 2018-02-14 NOTE — Telephone Encounter (Signed)
Pt informed.  Pt expressed understanding and is agreeable.  T. Lasharn Bufkin, CMA  

## 2018-02-14 NOTE — Telephone Encounter (Signed)
We have not prescribed these medications for the patient previously.  Please review and refill if appropriate.  T. Nelson, CMA  

## 2018-02-22 NOTE — Progress Notes (Signed)
Subjective:    Patient ID: William MoutonMichael A Robinette Sr., male    DOB: [birth year], 68 y.o.   MRN: [id number]  HPI: 09/29/17 OV:  William Hogan is here to establish as a new pt.  He is a pleasant 68 year old male. PMH:  Near syncopal event- 08/2016- antihypertensives were adjusted and he was referred to [facility]- records requested. Per pt, he has had Holter Study, Echo 09/06/16 Echo- Impressions:  - Mild LVH with LVEF 60-65%, grade 1 diastolic dysfunction. Upper   normal left atrial chamber size. Calcified mitral annulus with   mild mitral regurgitation. Trivial tricuspid regurgitation.  Cards re-started him on Bisoprolol 5mg - 1/2 tablet daily last year. He is also on Lisinopril 20mg  QD He reports taking his medications at bedtime due to SE  Vertigo dx'd 03/2017- treated with Meclizine 25mg  TID PRN  He estimates to drink 40 oz water a day and eats "what's ever is around". He stopped ETOH use 2012 and tobacco use 2018 He denies CP/dyspnea/dizziness/HA/palpiations, he states "I fell fine" He reports his home readings- SBPs 130-140, DBPs 70-90  10/20/17 OV: William Hogan is here for CPE He was [facility] by [facility] last week and adjustments were made to his anti-hypertensives: Lisinopril increased from 20mg  to 40mg  He was instructed that if home SBP was >140 to take full tablet of bisoprolol 5mg , if SBP <140 then only 1/2 tab of bisoprolol 5mg  He reports home SBP have been consistently running 130 He denies CP/dyspnea/dizzness/HA/palpitations  He has been increasing water and fruit intake He denies formal exercise, but states "I do a lot of walking around" He denies tobacco/ETOH use Reviewed all recent lab results  Healthcare Maintenance: Colonoscopy-pt declined Immunizations-pt declined AAA Screening-pt declined LDCT- order placed Discussed the importance of having screening tests and the risks associated with not completing measures.  02/23/2018 OV: William Hogan is  here for f/u: HTN, HLD, Lung Nodule/Dyspnea He was seen by pulmonology 12/08/17- no evidence of COPD,1st seen on plain cxr 09/06/17 -  See CT chest 11/25/17 > rec f/u 6 m LDSCT- Pulmonology will call him to schedule f/u CT - Your lisinopril is the likely cause of your symptoms at present but this does not appear to be severe enough to warrant immediate replacement so I'm going to defer this to your PCP - F/u PRN   Today he denies CP/dypnea and reports "cough isn't really bothering me". He has been trying to check labels of foods for "fat amounts" and has been trying to avoid foods high in saturated fat  He remains quite active with daily walking (unsure of distance) at [employer], [employer] and around yard. He continues to abstain from tobacco/ETOH He will wait to hear from Dr. Wert/Pulmology- when to repeat LDCT, f/u with him PRN He will f/u with [facility] May 2020   Patient Care Team    Relationship Specialty Notifications Start End  Julaine Fusianford, Jd Mccaster D, NP PCP - General Family Medicine  09/29/17   Elder NegusPatwardhan, Manish J, MD Consulting Physician Cardiology  09/29/17   Lanelle BalHarbrecht, Lawrence, MD Resident Internal Medicine  09/29/17     Patient Active Problem List   Diagnosis Date Noted  . Solitary pulmonary nodule -  ? hamartoma?  12/08/2017  . Nodule of right lung 11/29/2017  . Healthcare maintenance 10/20/2017  . High cholesterol 09/29/2017  . Vertigo 09/29/2017  . Hyponatremia 09/06/2016  . Essential hypertension 09/06/2016  . Dyspnea on exertion 09/06/2016  . Near syncope 09/05/2016  . Postural dizziness  with presyncope 09/05/2016     Past Medical History:  Diagnosis Date  . High cholesterol   . Hypertension      History reviewed. No pertinent surgical history.   History reviewed. No pertinent family history.   Social History   Substance and Sexual Activity  Drug Use No     Social History   Substance and Sexual Activity  Alcohol Use No   Comment: Quit  drinking alcohol 10/20/2010     Social History   Tobacco Use  Smoking Status Former Smoker  . Packs/day: 1.50  . Years: 40.00  . Pack years: 60.00  . Types: Cigarettes  . Last attempt to quit: 09/05/2016  . Years since quitting: 1.4  Smokeless Tobacco Never Used     Outpatient Encounter Medications as of 02/23/2018  Medication Sig  . atorvastatin (LIPITOR) 20 MG tablet Take 1 tablet (20 mg total) by mouth daily.  . bisoprolol (ZEBETA) 5 MG tablet Take 2.5 mg by mouth daily.  Marland Kitchen lisinopril (PRINIVIL,ZESTRIL) 40 MG tablet Take 40 mg by mouth daily.  . meclizine (ANTIVERT) 25 MG tablet Take 1 tablet (25 mg total) by mouth 3 (three) times daily as needed for dizziness.   No facility-administered encounter medications on file as of 02/23/2018.     Allergies: Clonidine derivatives  Body mass index is 30.13 kg/m.  Blood pressure (!) 143/85, pulse 72, temperature 98.3 F (36.8 C), temperature source Oral, height 5\' 9"  (1.753 m), weight 204 lb (92.5 kg), SpO2 94 %.  Review of Systems  Constitutional: Positive for fatigue. Negative for activity change, appetite change, chills, diaphoresis, fever and unexpected weight change.  Eyes: Negative for visual disturbance.  Respiratory: Negative for cough, chest tightness, shortness of breath, wheezing and stridor.   Cardiovascular: Negative for chest pain, palpitations and leg swelling.  Gastrointestinal: Negative for abdominal distention, abdominal pain, blood in stool, constipation, diarrhea, nausea and vomiting.  Genitourinary: Negative for difficulty urinating and flank pain.  Musculoskeletal: Positive for arthralgias and myalgias. Negative for back pain, gait problem, joint swelling, neck pain and neck stiffness.  Skin: Negative for color change, pallor, rash and wound.  Neurological: Negative for dizziness, tremors, weakness and headaches.  Hematological: Does not bruise/bleed easily.  Psychiatric/Behavioral: Negative for behavioral  problems, confusion, decreased concentration, dysphoric mood, hallucinations, self-injury, sleep disturbance and suicidal ideas. The patient is not nervous/anxious and is not hyperactive.        Objective:   Physical Exam Vitals signs and nursing note reviewed.  Constitutional:      General: He is not in acute distress.    Appearance: He is well-developed. He is not diaphoretic.  HENT:     Head: Normocephalic and atraumatic.     Right Ear: External ear normal. Decreased hearing noted. Tympanic membrane is not erythematous or bulging.     Left Ear: External ear normal. Decreased hearing noted. Tympanic membrane is not erythematous or bulging.     Nose: No mucosal edema or rhinorrhea.     Right Sinus: No maxillary sinus tenderness or frontal sinus tenderness.     Left Sinus: No maxillary sinus tenderness or frontal sinus tenderness.     Mouth/Throat:     Dentition: Abnormal dentition. Dental caries present.  Eyes:     Conjunctiva/sclera: Conjunctivae normal.     Pupils: Pupils are equal, round, and reactive to light.  Neck:     Musculoskeletal: Normal range of motion and neck supple.  Cardiovascular:     Rate and Rhythm: Normal rate  and regular rhythm.     Heart sounds: Normal heart sounds. No murmur.  Pulmonary:     Effort: Pulmonary effort is normal. No respiratory distress.     Breath sounds: Normal breath sounds. No stridor. No wheezing or rales.  Chest:     Chest wall: No tenderness.  Abdominal:     General: Bowel sounds are normal. There is no distension.     Palpations: Abdomen is soft. There is no mass.     Tenderness: There is no abdominal tenderness. There is no guarding or rebound.     Hernia: No hernia is present.  Musculoskeletal:        General: No tenderness.  Lymphadenopathy:     Cervical: No cervical adenopathy.  Skin:    General: Skin is warm and dry.     Capillary Refill: Capillary refill takes less than 2 seconds.     Coloration: Skin is not pale.      Findings: No erythema or rash.  Neurological:     Mental Status: He is alert and oriented to person, place, and time.     Deep Tendon Reflexes: Reflexes normal.  Psychiatric:        Behavior: Behavior normal.        Thought Content: Thought content normal.        Judgment: Judgment normal.       Assessment & Plan:   1. High cholesterol   2. Healthcare maintenance   3. Essential hypertension   4. Nodule of right lung     Healthcare maintenance Please continue all medications as directed. Stay well hydrated and follow heart healthy diet. Remain as active as possible- keep up the regular walking! Great job on abstaining from tobacco and alcohol. Please follow-up with Pulmonology and Cardiology as directed. Please follow-up here in 6 months- Medicare Wellness  Essential hypertension BP 143/85, HR 72 Continue Lisinopril 40mg  (cough not bothersome), Bisoprolol 5mg  - 1/2 tab daily- followed by [facility] annually, next OV may 2020  High cholesterol Lipids drawn today Currently on Atorvastatin 20mg  QD, denies myalgia's Walks daily and has been reducing saturated fat in diet  Nodule of right lung Pulmonology/Dr. Sherene Sires following  FOLLOW-UP:  Return in about 6 months (around 08/24/2018) for Medicare Wellness.

## 2018-02-23 ENCOUNTER — Ambulatory Visit (INDEPENDENT_AMBULATORY_CARE_PROVIDER_SITE_OTHER): Payer: Medicare Other | Admitting: Adult Health

## 2018-02-23 ENCOUNTER — Encounter: Payer: Self-pay | Admitting: Adult Health

## 2018-02-23 VITALS — BP 143/85 | HR 72 | Temp 98.3°F | Ht 69.0 in | Wt 204.0 lb

## 2018-02-23 DIAGNOSIS — I10 Essential (primary) hypertension: Secondary | ICD-10-CM | POA: Diagnosis not present

## 2018-02-23 DIAGNOSIS — R911 Solitary pulmonary nodule: Secondary | ICD-10-CM | POA: Diagnosis not present

## 2018-02-23 DIAGNOSIS — Z0000 Encounter for general adult medical examination without abnormal findings: Secondary | ICD-10-CM

## 2018-02-23 DIAGNOSIS — E7800 Pure hypercholesterolemia, unspecified: Secondary | ICD-10-CM

## 2018-02-23 NOTE — Assessment & Plan Note (Signed)
Pulmonology/Dr. Sherene Sires following

## 2018-02-23 NOTE — Assessment & Plan Note (Signed)
>>  ASSESSMENT AND PLAN FOR HIGH CHOLESTEROL WRITTEN ON 02/23/2018 10:06 AM BY DANFORD, KATY D, NP  Lipids drawn today Currently on Atorvastatin 20mg  QD, denies myalgia's Walks daily and has been reducing saturated fat in diet

## 2018-02-23 NOTE — Assessment & Plan Note (Signed)
Please continue all medications as directed. Stay well hydrated and follow heart healthy diet. Remain as active as possible- keep up the regular walking! Great job on abstaining from tobacco and alcohol. Please follow-up with Pulmonology and Cardiology as directed. Please follow-up here in 6 months- Medicare Wellness

## 2018-02-23 NOTE — Assessment & Plan Note (Signed)
Lipids drawn today Currently on Atorvastatin 20mg  QD, denies myalgia's Walks daily and has been reducing saturated fat in diet

## 2018-02-23 NOTE — Assessment & Plan Note (Signed)
BP 143/85, HR 72 Continue Lisinopril 40mg  (cough not bothersome), Bisoprolol 5mg  - 1/2 tab daily- followed by [facility] Cardiology annually, next OV may 2020

## 2018-02-23 NOTE — Patient Instructions (Addendum)

## 2018-02-24 ENCOUNTER — Other Ambulatory Visit: Payer: Self-pay | Admitting: Adult Health

## 2018-02-24 LAB — COMPREHENSIVE METABOLIC PANEL
ALK PHOS: 119 IU/L — AB (ref 39–117)
ALT: 27 IU/L (ref 0–44)
AST: 21 IU/L (ref 0–40)
Albumin/Globulin Ratio: 1.5 (ref 1.2–2.2)
Albumin: 4.5 g/dL (ref 3.6–4.8)
BUN/Creatinine Ratio: 11 (ref 10–24)
BUN: 13 mg/dL (ref 8–27)
Bilirubin Total: 0.8 mg/dL (ref 0.0–1.2)
CALCIUM: 10.1 mg/dL (ref 8.6–10.2)
CO2: 25 mmol/L (ref 20–29)
CREATININE: 1.17 mg/dL (ref 0.76–1.27)
Chloride: 101 mmol/L (ref 96–106)
GFR calc Af Amer: 74 mL/min/{1.73_m2} (ref >59)
GFR, EST NON AFRICAN AMERICAN: 64 mL/min/{1.73_m2} (ref >59)
GLOBULIN, TOTAL: 3 g/dL (ref 1.5–4.5)
Glucose: 107 mg/dL — ABNORMAL HIGH (ref 65–99)
POTASSIUM: 5.1 mmol/L (ref 3.5–5.2)
SODIUM: 140 mmol/L (ref 134–144)
Total Protein: 7.5 g/dL (ref 6.0–8.5)

## 2018-02-24 LAB — CBC
HEMATOCRIT: 50.2 % (ref 37.5–51.0)
HEMOGLOBIN: 16.8 g/dL (ref 13.0–17.7)
MCH: 28.6 pg (ref 26.6–33.0)
MCHC: 33.5 g/dL (ref 31.5–35.7)
MCV: 86 fL (ref 79–97)
Platelets: 379 10*3/uL (ref 150–450)
RBC: 5.87 x10E6/uL — ABNORMAL HIGH (ref 4.14–5.80)
RDW: 12.6 % (ref 11.6–15.4)
WBC: 11 10*3/uL — ABNORMAL HIGH (ref 3.4–10.8)

## 2018-02-24 LAB — LIPID PANEL
CHOLESTEROL TOTAL: 153 mg/dL (ref 100–199)
Chol/HDL Ratio: 3.5 ratio (ref 0.0–5.0)
HDL: 44 mg/dL (ref >39)
LDL Calculated: 92 mg/dL (ref 0–99)
TRIGLYCERIDES: 83 mg/dL (ref 0–149)
VLDL Cholesterol Cal: 17 mg/dL (ref 5–40)

## 2018-02-24 MED ORDER — ATORVASTATIN CALCIUM 40 MG PO TABS: 40.0000 mg | ORAL_TABLET | Freq: Every day | ORAL | 2 refills | Status: DC

## 2018-04-18 ENCOUNTER — Other Ambulatory Visit: Payer: Self-pay | Admitting: Internal Medicine

## 2018-04-18 DIAGNOSIS — R911 Solitary pulmonary nodule: Secondary | ICD-10-CM

## 2018-05-27 ENCOUNTER — Ambulatory Visit: Payer: Medicare Other

## 2018-05-30 ENCOUNTER — Ambulatory Visit: Payer: Medicare Other | Admitting: Internal Medicine

## 2018-06-14 ENCOUNTER — Encounter: Payer: Self-pay | Admitting: Cardiology

## 2018-06-15 ENCOUNTER — Ambulatory Visit: Payer: Medicare Other | Admitting: Cardiology

## 2018-06-15 ENCOUNTER — Other Ambulatory Visit: Payer: Self-pay

## 2018-06-15 ENCOUNTER — Ambulatory Visit: Payer: Self-pay | Admitting: Cardiology

## 2018-06-15 ENCOUNTER — Encounter: Payer: Self-pay | Admitting: Cardiology

## 2018-06-15 VITALS — BP 133/82 | Ht 71.0 in | Wt 202.0 lb

## 2018-06-15 DIAGNOSIS — I10 Essential (primary) hypertension: Secondary | ICD-10-CM | POA: Diagnosis not present

## 2018-06-15 DIAGNOSIS — E782 Mixed hyperlipidemia: Secondary | ICD-10-CM

## 2018-06-15 MED ORDER — BISOPROLOL FUMARATE 5 MG PO TABS: 2.5000 mg | ORAL_TABLET | Freq: Every day | ORAL | 2 refills | Status: DC

## 2018-06-16 ENCOUNTER — Encounter: Payer: Self-pay | Admitting: Cardiology

## 2018-06-16 DIAGNOSIS — E782 Mixed hyperlipidemia: Secondary | ICD-10-CM | POA: Insufficient documentation

## 2018-06-16 NOTE — Progress Notes (Signed)
Telephone visit note  Subjective:   William Pasqual., male    DOB: 1950-09-23, 68 y.o.   MRN: [id number]   I connected with the patient on 06/15/18 by a telephone call and verified that I am speaking with the correct person using two identifiers.     I offered the patient a video enabled application for a virtual visit. Unfortunately, this could not be accomplished due to technical difficulties/lack of video enabled phone/computer. I discussed the limitations of evaluation and management by telemedicine and the availability of in person appointments. The patient expressed understanding and agreed to proceed.   This visit type was conducted due to national recommendations for restrictions regarding the COVID-19 Pandemic (e.g. social distancing).  This format is felt to be most appropriate for this patient at this time.  All issues noted in this document were discussed and addressed.  No physical exam was performed (except for noted visual exam findings with Tele health visits).  The patient has consented to conduct a Tele health visit and understands insurance will be billed.   Chief complaint:  Hypertension  HPI  68 year old Caucasian male with hypertension, hyperlipidemia, vertigo.  He has been doing well. His symptoms of dizziness improved after stopping clonidine. He denies chest pain, shortness of breath, palpitations, leg edema, orthopnea, PND, TIA/syncope.  Past Medical History:  Diagnosis Date  . High cholesterol   . Hypertension      History reviewed. No pertinent surgical history.   Social History   Socioeconomic History  . Marital status: Married    Spouse name: Not on file  . Number of children: 3  . Years of education: Not on file  . Highest education level: Not on file  Occupational History  . Not on file  Social Needs  . Financial resource strain: Not on file  . Food insecurity:    Worry: Not on file    Inability: Not on file  . Transportation  needs:    Medical: Not on file    Non-medical: Not on file  Tobacco Use  . Smoking status: Former Smoker    Packs/day: 1.50    Years: 40.00    Pack years: 60.00    Types: Cigarettes    Last attempt to quit: 09/05/2016    Years since quitting: 1.7  . Smokeless tobacco: Never Used  Substance and Sexual Activity  . Alcohol use: No    Comment: Quit drinking alcohol 10/20/2010  . Drug use: No  . Sexual activity: Not Currently  Lifestyle  . Physical activity:    Days per week: Not on file    Minutes per session: Not on file  . Stress: Not on file  Relationships  . Social connections:    Talks on phone: Not on file    Gets together: Not on file    Attends religious service: Not on file    Active member of club or organization: Not on file    Attends meetings of clubs or organizations: Not on file    Relationship status: Not on file  . Intimate partner violence:    Fear of current or ex partner: Not on file    Emotionally abused: Not on file    Physically abused: Not on file    Forced sexual activity: Not on file  Other Topics Concern  . Not on file  Social History Narrative  . Not on file     Family History  Problem Relation Age of Onset  .  Stroke Father      Current Outpatient Medications on File Prior to Visit  Medication Sig Dispense Refill  . atorvastatin (LIPITOR) 40 MG tablet Take 1 tablet (40 mg total) by mouth daily. 90 tablet 2  . lisinopril (PRINIVIL,ZESTRIL) 40 MG tablet Take 40 mg by mouth daily.    . meclizine (ANTIVERT) 25 MG tablet Take 1 tablet (25 mg total) by mouth 3 (three) times daily as needed for dizziness. 30 tablet 0   No current facility-administered medications on file prior to visit.     Cardiovascular studies:  EKG 06/10/2017: Sinus rhythm 83 bpm. Poor R wave progression, otherwise normal EKG  MRI Brain 03/2017: IMPRESSION: 1. No acute intracranial abnormality. 2. Age-related cerebral atrophy with mild chronic small vessel ischemic  disease. 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  Echocardiogram 09/05/2016:  - Left ventricle: The cavity size was normal. Wall thickness was   increased in a pattern of mild LVH. Systolic function was normal.   The estimated ejection fraction was in the range of 50% to 55%. - Aortic valve: Trileaflet; mildly calcified leaflets. - Mitral valve: Calcified annulus. There was mild regurgitation. - Left atrium: The atrium was at the upper limits of normal in   size. - Right atrium: Central venous pressure (est): 3 mm Hg. - Atrial septum: No defect or patent foramen ovale was identified. - Tricuspid valve: There was trivial regurgitation. - Pulmonary arteries: Systolic pressure could not be accurately   estimated. - Pericardium, extracardiac: A prominent pericardial fat pad was   present. There was no pericardial effusion.   Impressions:   - Mild LVH with LVEF 60-65%, grade 1 diastolic dysfunction. Upper   normal left atrial chamber size. Calcified mitral annulus with   mild mitral regurgitation. Trivial tricuspid regurgitation.  Lexiscan myoview stress test 11/09/2016: 1. Stress EKG is non diagnostic for ischemia due to pharmacologic    stress testing.    2. Perfusion imaging study demonstrates no evidence of ischemia with normal LV systolic function, calculated EF 64% without regional wall motion abnormality. Overall low risk study.  Event monitor 10/16/2016- 10/30/2016: Sins rhythm w/PAC's SInus tachycardia Sinus bradycardia 32 bpm with rate dependent aberrancy (3:00 AM) Junctional tachcyardia with rate dependent aberrancy    Recent labs: 09/06/2016:  Glucose 111. BUN 11/creatinine 1.22 sodium 138, potassium 4.6 H/H 13/40.  Platelets 389.  Review of Systems  Constitution: Negative for decreased appetite, malaise/fatigue, weight gain and weight loss.  HENT: Negative for congestion.   Eyes: Negative for visual disturbance.  Cardiovascular: Negative for chest  pain, dyspnea on exertion, leg swelling, palpitations and syncope.  Respiratory: Negative for shortness of breath.   Endocrine: Negative for cold intolerance.  Hematologic/Lymphatic: Does not bruise/bleed easily.  Skin: Negative for itching and rash.  Musculoskeletal: Negative for myalgias.  Gastrointestinal: Negative for abdominal pain, nausea and vomiting.  Genitourinary: Negative for dysuria.  Neurological: Negative for dizziness and weakness.  Psychiatric/Behavioral: The patient is not nervous/anxious.   All other systems reviewed and are negative.        Vitals:   06/15/18 1219  BP: 133/82   (Measured by the patient using a home BP monitor)  Physical Exam: Not performed, as this is a telephone visit      Assessment & Recommendations:  68 year old Caucasian male with hypertension, hyperlipidemia, vertigo.  1. Essential hypertension Well controlled. Continue current antihypertensive therapy. Refilled bisoprolol.  2. Mixed hyperlipidemia Continue atoravastatin.  Follow up with PCP. I will see him on as needed basis.  Elder Negus, MD [facility] Pager: [phone] Office: [phone] If no answer Cell [phone]

## 2018-08-10 ENCOUNTER — Ambulatory Visit: Payer: Medicare Other

## 2018-08-24 ENCOUNTER — Other Ambulatory Visit: Payer: Self-pay

## 2018-08-24 ENCOUNTER — Encounter: Payer: Self-pay | Admitting: Adult Health

## 2018-08-24 ENCOUNTER — Ambulatory Visit (INDEPENDENT_AMBULATORY_CARE_PROVIDER_SITE_OTHER): Payer: Medicare Other | Admitting: Adult Health

## 2018-08-24 VITALS — BP 134/76 | HR 68 | Temp 98.3°F | Ht 71.0 in | Wt 198.0 lb

## 2018-08-24 DIAGNOSIS — Z0000 Encounter for general adult medical examination without abnormal findings: Secondary | ICD-10-CM

## 2018-08-24 NOTE — Progress Notes (Signed)
Virtual Visit via Telephone Note  I connected with William Kinnier Sr. on 08/24/18 at  1:15 PM EDT by telephone and verified that I am speaking with the correct person using two identifiers.  Location: Patient: [name]   I discussed the limitations, risks, security and privacy concerns of performing an evaluation and management service by telephone and the availability of in person appointments. I also discussed with the patient that there may be a patient responsible charge related to this service. The patient expressed understanding and agreed to proceed.    Subjective:   William SHEAR Sr. is a 68 y.o. male who presents for Medicare Annual/Subsequent preventive examination.  Review of Systems: Review of Systems: General:   Denies fever, chills, unexplained weight loss.  Optho/Auditory:   Denies visual changes, blurred vision/LOV Respiratory:   Denies SOB, DOE more than baseline levels.  Cardiovascular:   Denies chest pain, palpitations, new onset peripheral edema  Gastrointestinal:   Denies nausea, vomiting, diarrhea.  Genitourinary: Denies dysuria, freq/ urgency, flank pain or discharge from genitals.  Endocrine:     Denies hot or cold intolerance, polyuria, polydipsia. Musculoskeletal:   Denies unexplained myalgias, joint swelling, unexplained arthralgias, gait problems.  Skin:  Denies rash, suspicious lesions Neurological:     Denies dizziness, unexplained weakness, numbness  Psychiatric/Behavioral:   Denies mood changes, suicidal or homicidal ideations, hallucinations    Objective:    Vitals: BP 134/76   Pulse 68   Temp 98.3 F (36.8 C) (Oral)   Ht 5\' 11"  (1.803 m)   Wt 198 lb (89.8 kg)   BMI 27.62 kg/m   Body mass index is 27.62 kg/m.  Advanced Directives 09/29/2017 03/12/2017 09/06/2016 09/05/2016  Does Patient Have a Medical Advance Directive? No No No No  Would patient like information on creating a medical advance directive? No - Patient  declined No - Patient declined No - Patient declined -    Tobacco Social History   Tobacco Use  Smoking Status Former Smoker  . Packs/day: 1.50  . Years: 40.00  . Pack years: 60.00  . Types: Cigarettes  . Quit date: 09/05/2016  . Years since quitting: 1.9  Smokeless Tobacco Never Used     Counseling given: Not Answered    Past Medical History:  Diagnosis Date  . High cholesterol   . Hypertension    History reviewed. No pertinent surgical history. Family History  Problem Relation Age of Onset  . Stroke Father    Social History   Socioeconomic History  . Marital status: Married    Spouse name: Not on file  . Number of children: 3  . Years of education: Not on file  . Highest education level: Not on file  Occupational History  . Not on file  Social Needs  . Financial resource strain: Not on file  . Food insecurity    Worry: Not on file    Inability: Not on file  . Transportation needs    Medical: Not on file    Non-medical: Not on file  Tobacco Use  . Smoking status: Former Smoker    Packs/day: 1.50    Years: 40.00    Pack years: 60.00    Types: Cigarettes    Quit date: 09/05/2016    Years since quitting: 1.9  . Smokeless tobacco: Never Used  Substance and Sexual Activity  . Alcohol use: No    Comment: Quit drinking alcohol 10/20/2010  . Drug use: No  . Sexual activity: Not  Currently  Lifestyle  . Physical activity    Days per week: Not on file    Minutes per session: Not on file  . Stress: Not on file  Relationships  . Social Musicianconnections    Talks on phone: Not on file    Gets together: Not on file    Attends religious service: Not on file    Active member of club or organization: Not on file    Attends meetings of clubs or organizations: Not on file    Relationship status: Not on file  Other Topics Concern  . Not on file  Social History Narrative  . Not on file    Outpatient Encounter Medications as of 08/24/2018  Medication Sig  .  atorvastatin (LIPITOR) 40 MG tablet Take 1 tablet (40 mg total) by mouth daily.  . bisoprolol (ZEBETA) 5 MG tablet Take 0.5 tablets (2.5 mg total) by mouth daily.  Marland Kitchen. lisinopril (PRINIVIL,ZESTRIL) 40 MG tablet Take 40 mg by mouth daily.  . meclizine (ANTIVERT) 25 MG tablet Take 1 tablet (25 mg total) by mouth 3 (three) times daily as needed for dizziness.   No facility-administered encounter medications on file as of 08/24/2018.     Activities of Daily Living In your present state of health, do you have any difficulty performing the following activities: 08/24/2018 10/20/2017  Hearing? Malvin JohnsY Y  Vision? Y N  Difficulty concentrating or making decisions? N N  Walking or climbing stairs? N Y  Dressing or bathing? N N  Doing errands, shopping? N N  Some recent data might be hidden    Patient Care Team: Julaine Fusianford, Katy D, NP as PCP - General (Family Medicine) Elder NegusPatwardhan, Manish J, MD as Consulting Physician (Cardiology) Lanelle BalHarbrecht, Lawrence, MD as Resident (Internal Medicine)   Assessment:   This is a routine wellness examination for William Hogan.  Exercise Activities and Dietary recommendations    Goals   None     Fall Risk Fall Risk  08/24/2018 10/20/2017  Falls in the past year? 0 No  Follow up Falls evaluation completed -   Is the patient's home free of loose throw rugs in walkways, pet beds, electrical cords, etc?   yes      Grab bars in the bathroom? yes      Handrails on the stairs?   yes      Adequate lighting?   yes  Timed Get Up and Go Performed: N/A, encounter not performed in clinic  Depression Screen PHQ 2/9 Scores 08/24/2018 02/23/2018 11/29/2017 09/29/2017  PHQ - 2 Score 0 0 0 0  PHQ- 9 Score 0 0 0 0    Cognitive Function     6CIT Screen 08/24/2018  What Year? 0 points  What month? 0 points  What time? 0 points  Count back from 20 0 points  Months in reverse 0 points  Repeat phrase 6 points  Total Score 6    Immunization History  Administered Date(s) Administered   . Influenza, High Dose Seasonal PF 01/14/2018  . Pneumococcal Conjugate-13 09/29/2017    Qualifies for Shingles Vaccine?pt declined  Screening Tests Health Maintenance  Topic Date Due  . Hepatitis C Screening  11/23/1950  . TETANUS/TDAP  11/08/1968  . COLONOSCOPY  11/09/1999  . INFLUENZA VACCINE  09/10/2018  . PNA vac Low Risk Adult (2 of 2 - PPSV23) 09/30/2018   Cancer Screenings: Lung: Low Dose CT Chest recommended if Age 84-80 years, 30 pack-year currently smoking OR have quit w/in 15years. Patient does qualify.  Colorectal: Pt Declined  Additional Screenings:  Hepatitis C Screening: PT Declined       Plan:  Continue all medications as directed  Remain well hydrated, follow heart healthy diet Continue to abstain from tobacco use Continue to social distance and wear a mask when in public. F/u OV 3 months with fasting labs   I have personally reviewed and noted the following in the patient's chart:   . Medical and social history . Use of alcohol, tobacco or illicit drugs  . Current medications and supplements . Functional ability and status . Nutritional status . Physical activity . Advanced directives . List of other physicians . Hospitalizations, surgeries, and ER visits in previous 12 months . Vitals . Screenings to include cognitive, depression, and falls . Referrals and appointments  In addition, I have reviewed and discussed with patient certain preventive protocols, quality metrics, and best practice recommendations. A written personalized care plan for preventive services as well as general preventive health recommendations were provided to patient.     Julaine FusiKaty D Danford, NP  08/24/2018

## 2018-10-29 IMAGING — CT CT CHEST LUNG CANCER SCREENING LOW DOSE W/O CM
2 of 6 series · 13 of 40 positions shown, 16 images · non-contrast
Comparison: None.

ADDENDUM:
Comparison with prior chest x-rays was requested. Direct comparison
between modalities is not considered very accurate. However, within
these limitations, this nodule appears roughly stable between the
low-dose lung cancer screening chest CT and prior chest x-ray dating
back to 09/06/2016. Assessment remains as a Lung-RADS 4AS lesion.
Follow up low-dose chest CT without contrast in 3 months (please use
the following order, "CT CHEST LCS NODULE FOLLOW-UP W/O CM") is
recommended. Given the stability compared to the prior study, PET-CT
is unlikely to be warranted at this time.
CLINICAL DATA: 67-year-old male former smoker (quit in August 2016)
with 45 pack-year history of smoking. Lung cancer screening
examination.

EXAM:
CT CHEST WITHOUT CONTRAST LOW-DOSE FOR LUNG CANCER SCREENING
TECHNIQUE: Multidetector CT imaging of the chest was performed following the
standard protocol without IV contrast.

[Series 4: lung 1.00 br44 cor · coronal · 0.56mm/px · 3 of 386 slices shown]
[im 78/386  lung]
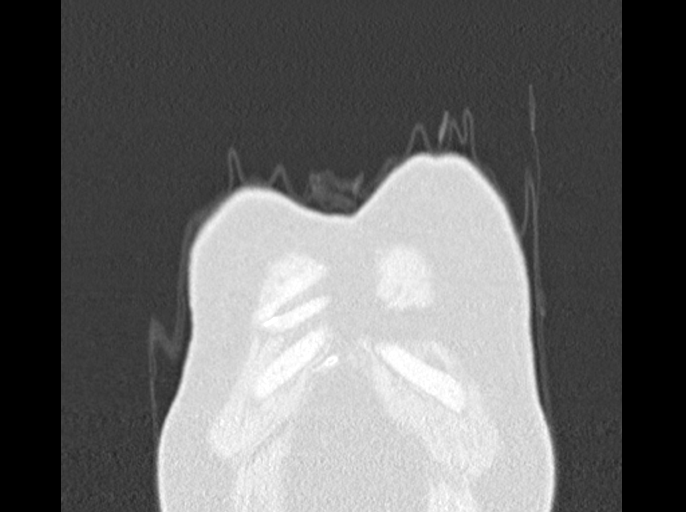
[im 155/386  lung]
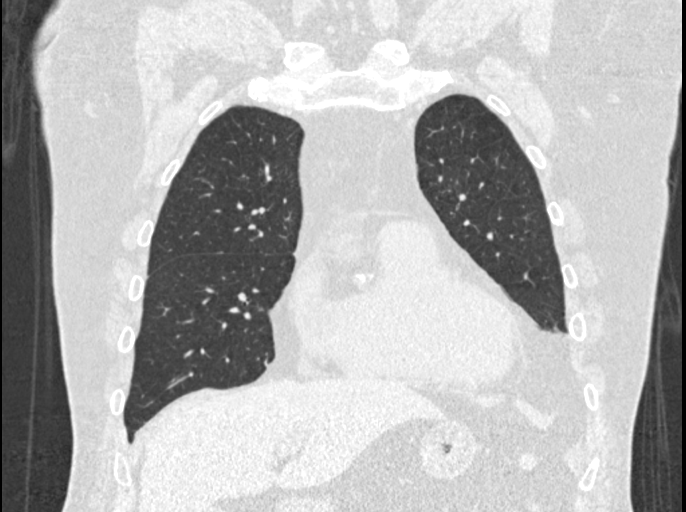
[im 232/386  lung]
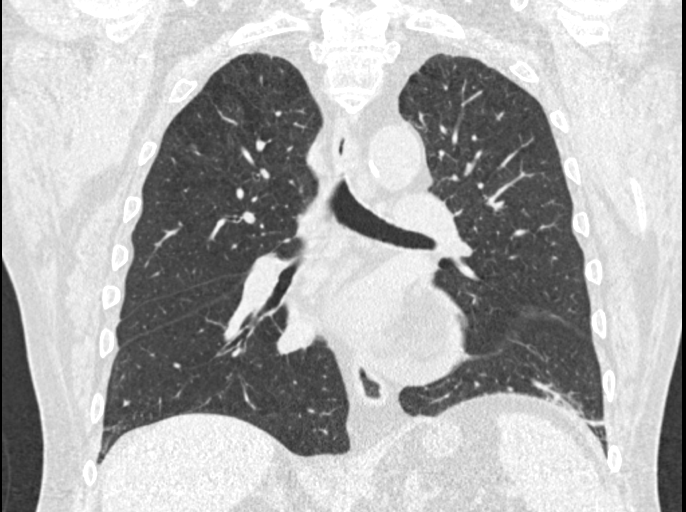

[Series 9: lung 1.00 br60 · axial · 0.75mm/px · z∈[-981,-747]mm · 10 of 287 slices shown, 13 images]
[im 27/287  mediastinal]
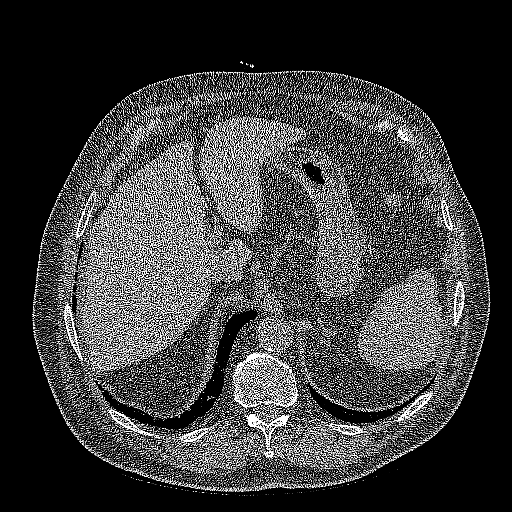
[im 27/287  lung]
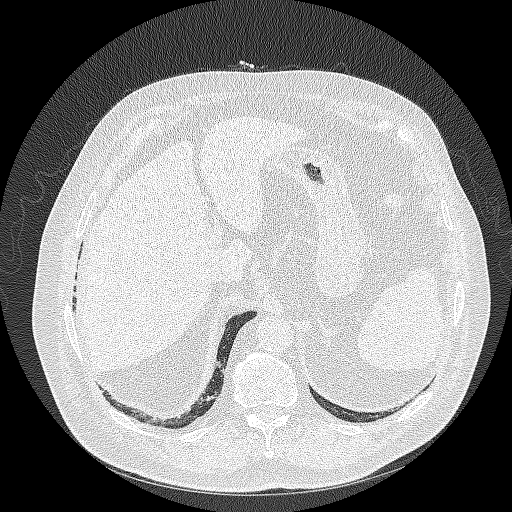
[im 53/287  lung]
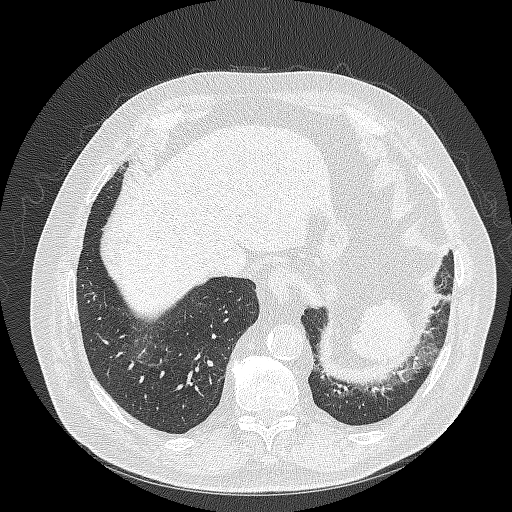
[im 79/287  lung]
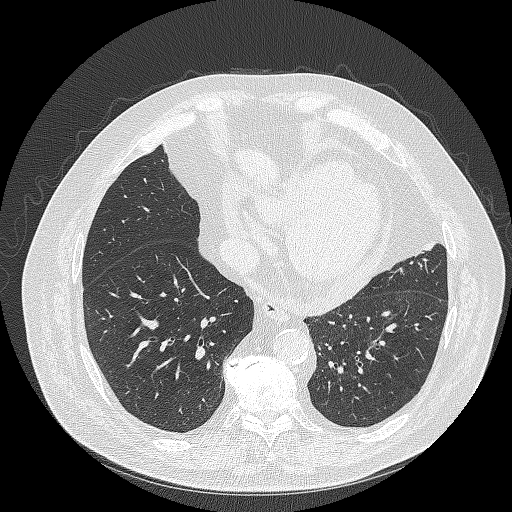
[im 105/287  lung]
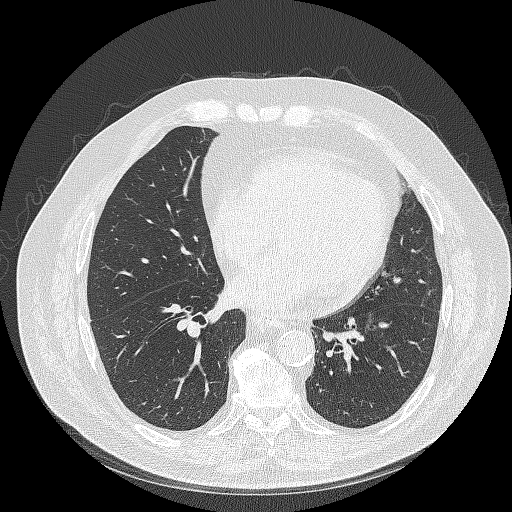
[im 131/287  mediastinal]
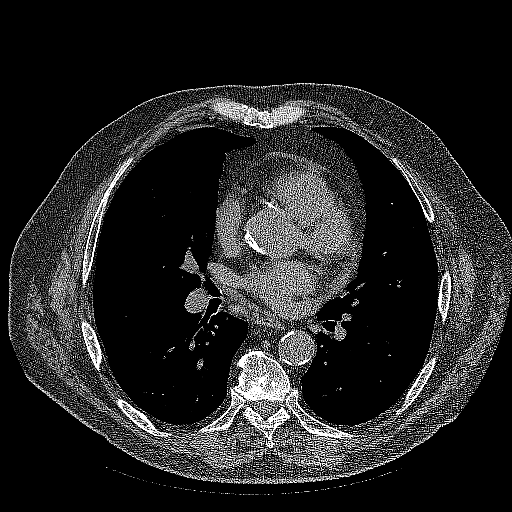
[im 131/287  lung]
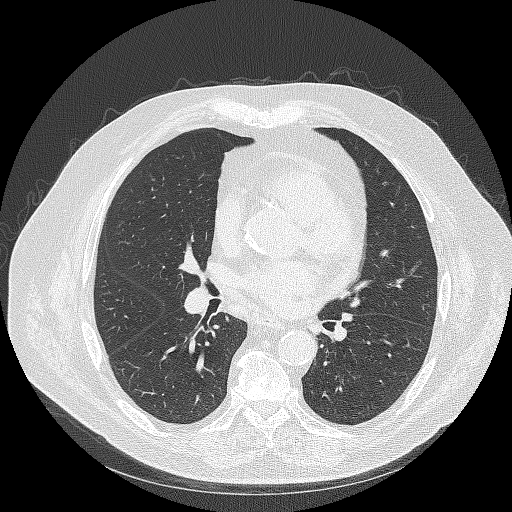
[im 157/287  lung]
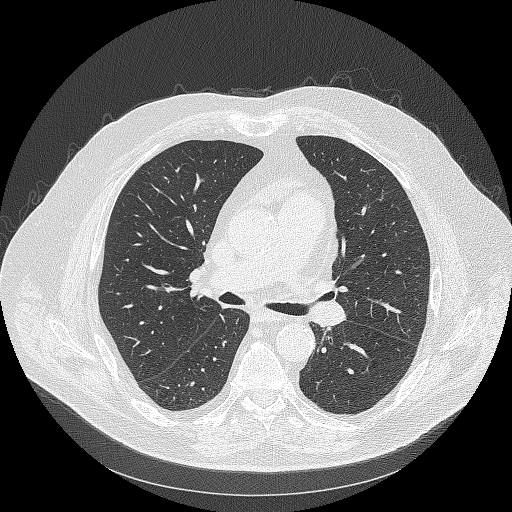
[im 183/287  lung]
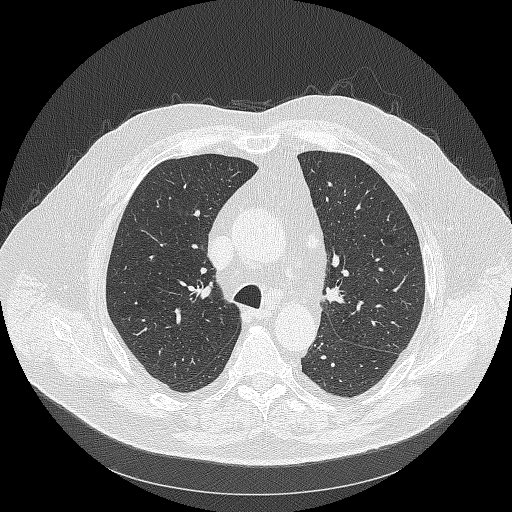
[im 209/287  lung]
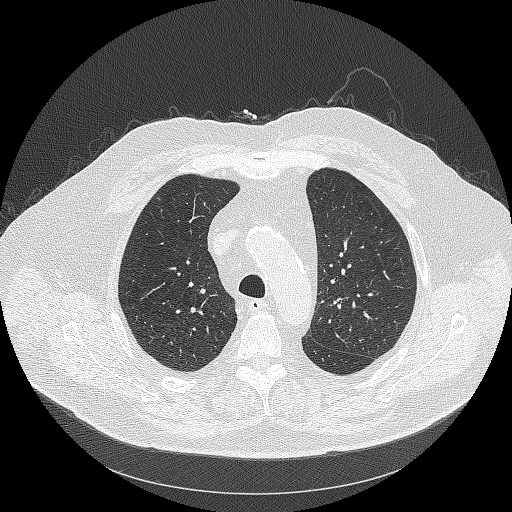
[im 235/287  mediastinal]
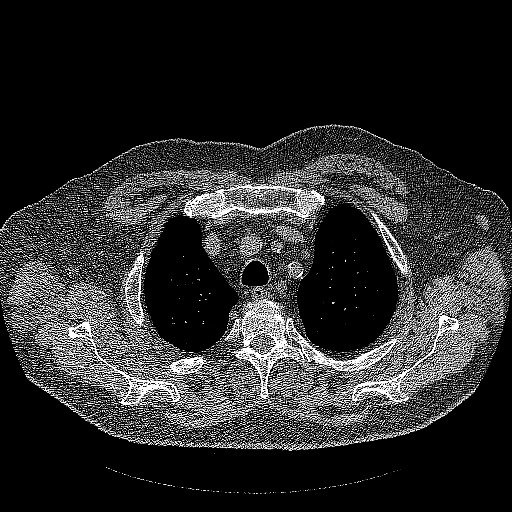
[im 235/287  lung]
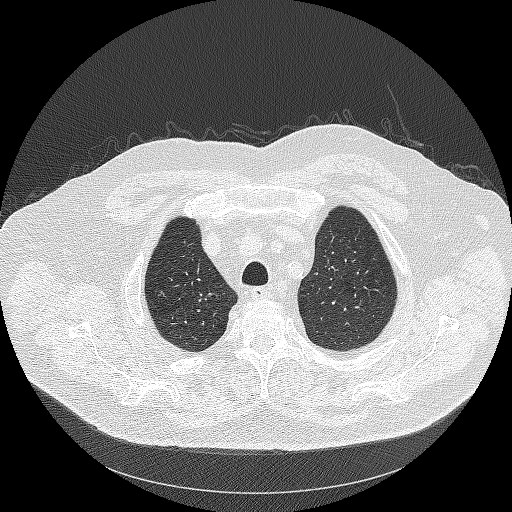
[im 261/287  lung]
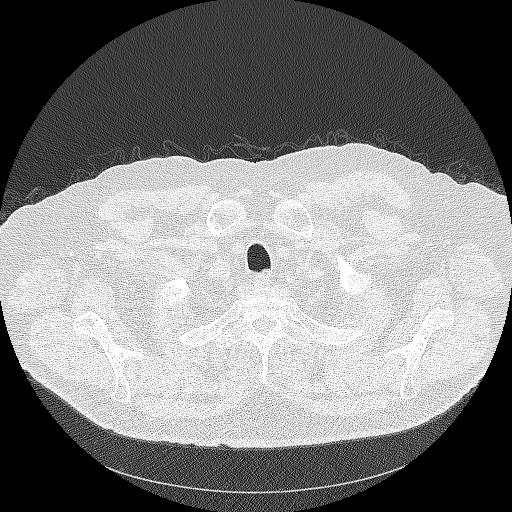

[13 of 40 positions shown; findings below may reference images not displayed]

FINDINGS: Cardiovascular: Heart size is normal. Small amount of pericardial
fluid and/or thickening, unlikely to be of any hemodynamic
significance at this time. No associated pericardial calcifications.
There is aortic atherosclerosis, as well as atherosclerosis of the
great vessels of the mediastinum and the coronary arteries,
including calcified atherosclerotic plaque in the left main, left
anterior descending, left circumflex and right coronary arteries.
Mild calcifications of the aortic valve and mitral annulus.

Mediastinum/Nodes: No pathologically enlarged mediastinal or hilar
lymph nodes. Please note that accurate exclusion of hilar adenopathy
is limited on noncontrast CT scans. Esophagus is unremarkable in
appearance. No axillary lymphadenopathy.

Lungs/Pleura: In the right upper lobe there is a macrolobulated
nodule (axial image 90 of series 3), with a volume derived mean
diameter of 10.8 mm. No acute consolidative airspace disease. No
pleural effusions. Mild diffuse bronchial wall thickening with mild
centrilobular and paraseptal emphysema.

Upper Abdomen: Aortic atherosclerosis.

Musculoskeletal: There are no aggressive appearing lytic or blastic
lesions noted in the visualized portions of the skeleton.
IMPRESSION: 1. Lung-RADS 4AS, suspicious. Follow up low-dose chest CT without
contrast in 3 months (please use the following order, "CT CHEST LCS
NODULE FOLLOW-UP W/O CM") is recommended. Alternatively, PET may be
considered when there is a solid component 8mm or larger.
2. The "S" modifier above refers to potentially clinically
significant non lung cancer related findings. Specifically, there is
aortic atherosclerosis, in addition to left main and 3 vessel
coronary artery disease. Please note that although the presence of
coronary artery calcium documents the presence of coronary artery
disease, the severity of this disease and any potential stenosis
cannot be assessed on this non-gated CT examination. Assessment for
potential risk factor modification, dietary therapy or pharmacologic
therapy may be warranted, if clinically indicated.
3. Mild diffuse bronchial wall thickening with mild centrilobular
and paraseptal emphysema; imaging findings suggestive of underlying
COPD.
4. There are calcifications of the aortic valve and mitral annulus.
Echocardiographic correlation for evaluation of potential valvular
dysfunction may be warranted if clinically indicated.

Aortic Atherosclerosis (AT3N3-U9W.W) and Emphysema (AT3N3-59L.V).

## 2018-11-24 ENCOUNTER — Other Ambulatory Visit: Payer: Self-pay

## 2018-11-24 ENCOUNTER — Ambulatory Visit (INDEPENDENT_AMBULATORY_CARE_PROVIDER_SITE_OTHER): Payer: Medicare Other | Admitting: Adult Health

## 2018-11-24 ENCOUNTER — Encounter: Payer: Self-pay | Admitting: Adult Health

## 2018-11-24 VITALS — BP 186/91 | HR 75 | Temp 98.5°F | Ht 71.0 in | Wt 198.1 lb

## 2018-11-24 DIAGNOSIS — R0600 Dyspnea, unspecified: Secondary | ICD-10-CM

## 2018-11-24 DIAGNOSIS — I10 Essential (primary) hypertension: Secondary | ICD-10-CM | POA: Diagnosis not present

## 2018-11-24 DIAGNOSIS — R42 Dizziness and giddiness: Secondary | ICD-10-CM | POA: Diagnosis not present

## 2018-11-24 DIAGNOSIS — R911 Solitary pulmonary nodule: Secondary | ICD-10-CM | POA: Diagnosis not present

## 2018-11-24 DIAGNOSIS — R0609 Other forms of dyspnea: Secondary | ICD-10-CM

## 2018-11-24 DIAGNOSIS — Z0000 Encounter for general adult medical examination without abnormal findings: Secondary | ICD-10-CM

## 2018-11-24 DIAGNOSIS — E7800 Pure hypercholesterolemia, unspecified: Secondary | ICD-10-CM | POA: Diagnosis not present

## 2018-11-24 DIAGNOSIS — Z23 Encounter for immunization: Secondary | ICD-10-CM

## 2018-11-24 DIAGNOSIS — E871 Hypo-osmolality and hyponatremia: Secondary | ICD-10-CM | POA: Diagnosis not present

## 2018-11-24 DIAGNOSIS — E782 Mixed hyperlipidemia: Secondary | ICD-10-CM | POA: Diagnosis not present

## 2018-11-24 MED ORDER — BISOPROLOL FUMARATE 5 MG PO TABS: 5.0000 mg | ORAL_TABLET | Freq: Every day | ORAL | 0 refills | Status: DC

## 2018-11-24 MED ORDER — BISOPROLOL FUMARATE 5 MG PO TABS: 2.5000 mg | ORAL_TABLET | Freq: Every day | ORAL | 2 refills | Status: DC

## 2018-11-24 NOTE — Assessment & Plan Note (Signed)
Stable, no increase

## 2018-11-24 NOTE — Assessment & Plan Note (Signed)
Please call Dr. Wert/Pulmonology to follow-up on Chest CT. Dr. Gwenlyn Perking [facility] [phone]

## 2018-11-24 NOTE — Assessment & Plan Note (Signed)
>>  ASSESSMENT AND PLAN FOR SOLITARY PULMONARY NODULE -  ? HAMARTOMA?  WRITTEN ON 11/24/2018 10:32 AM BY DANFORD, Orpha Bur D, NP  Please call Dr. Wert/Pulmonology to follow-up on Chest CT. Dr. Eulah Pont [facility] [phone]

## 2018-11-24 NOTE — Patient Instructions (Addendum)
Hypertension, Adult Hypertension is another name for high blood pressure. High blood pressure forces your heart to work harder to pump blood. This can cause problems over time. There are two numbers in a blood pressure reading. There is a top number (systolic) over a bottom number (diastolic). It is best to have a blood pressure that is below 120/80. Healthy choices can help lower your blood pressure, or you may need medicine to help lower it. What are the causes? The cause of this condition is not known. Some conditions may be related to high blood pressure. What increases the risk?  Smoking.  Having type 2 diabetes mellitus, high cholesterol, or both.  Not getting enough exercise or physical activity.  Being overweight.  Having too much fat, sugar, calories, or salt (sodium) in your diet.  Drinking too much alcohol.  Having long-term (chronic) kidney disease.  Having a family history of high blood pressure.  Age. Risk increases with age.  Race. You may be at higher risk if you are African American.  Gender. Men are at higher risk than women before age 45. After age 65, women are at higher risk than men.  Having obstructive sleep apnea.  Stress. What are the signs or symptoms?  High blood pressure may not cause symptoms. Very high blood pressure (hypertensive crisis) may cause: ? Headache. ? Feelings of worry or nervousness (anxiety). ? Shortness of breath. ? Nosebleed. ? A feeling of being sick to your stomach (nausea). ? Throwing up (vomiting). ? Changes in how you see. ? Very bad chest pain. ? Seizures. How is this treated?  This condition is treated by making healthy lifestyle changes, such as: ? Eating healthy foods. ? Exercising more. ? Drinking less alcohol.  Your health care provider may prescribe medicine if lifestyle changes are not enough to get your blood pressure under control, and if: ? Your top number is above 130. ? Your bottom number is above 80.   Your personal target blood pressure may vary. Follow these instructions at home: Eating and drinking   If told, follow the DASH eating plan. To follow this plan: ? Fill one half of your plate at each meal with fruits and vegetables. ? Fill one fourth of your plate at each meal with whole grains. Whole grains include whole-wheat pasta, brown rice, and whole-grain bread. ? Eat or drink low-fat dairy products, such as skim milk or low-fat yogurt. ? Fill one fourth of your plate at each meal with low-fat (lean) proteins. Low-fat proteins include fish, chicken without skin, eggs, beans, and tofu. ? Avoid fatty meat, cured and processed meat, or chicken with skin. ? Avoid pre-made or processed food.  Eat less than 1,500 mg of salt each day.  Do not drink alcohol if: ? Your doctor tells you not to drink. ? You are pregnant, may be pregnant, or are planning to become pregnant.  If you drink alcohol: ? Limit how much you use to:  0-1 drink a day for women.  0-2 drinks a day for men. ? Be aware of how much alcohol is in your drink. In the [place] one drink equals one 12 oz bottle of beer (355 mL), one 5 oz glass of wine (148 mL), or one 1 oz glass of hard liquor (44 mL). Lifestyle   Work with your doctor to stay at a healthy weight or to lose weight. Ask your doctor what the best weight is for you.  Get at least 30 minutes of exercise most   days of the week. This may include walking, swimming, or biking.  Get at least 30 minutes of exercise that strengthens your muscles (resistance exercise) at least 3 days a week. This may include lifting weights or doing Pilates.  Do not use any products that contain nicotine or tobacco, such as cigarettes, e-cigarettes, and chewing tobacco. If you need help quitting, ask your doctor.  Check your blood pressure at home as told by your doctor.  Keep all follow-up visits as told by your doctor. This is important. Medicines  Take over-the-counter and  prescription medicines only as told by your doctor. Follow directions carefully.  Do not skip doses of blood pressure medicine. The medicine does not work as well if you skip doses. Skipping doses also puts you at risk for problems.  Ask your doctor about side effects or reactions to medicines that you should watch for. Contact a doctor if you:  Think you are having a reaction to the medicine you are taking.  Have headaches that keep coming back (recurring).  Feel dizzy.  Have swelling in your ankles.  Have trouble with your vision. Get help right away if you:  Get a very bad headache.  Start to feel mixed up (confused).  Feel weak or numb.  Feel faint.  Have very bad pain in your: ? Chest. ? Belly (abdomen).  Throw up more than once.  Have trouble breathing. Summary  Hypertension is another name for high blood pressure.  High blood pressure forces your heart to work harder to pump blood.  For most people, a normal blood pressure is less than 120/80.  Making healthy choices can help lower blood pressure. If your blood pressure does not get lower with healthy choices, you may need to take medicine. This information is not intended to replace advice given to you by your health care provider. Make sure you discuss any questions you have with your health care provider. Document Released: 07/15/2007 Document Revised: 10/06/2017 Document Reviewed: 10/06/2017 Elsevier Patient Education  2020 [place] of your blood pressure readings were well above goal. Continue current dose of Lisinopril and 1/2 tablet Bisoprolol. Take blood pressure medications in the morning. Please check blood pressure and heart rate daily- record. Follow-up here in 2 weeks- bring blood pressure/heart rate log.   If you experience severe shortness of breath, chest pain/chest pressure- please seek immediate medical assistance.  We will call you with your lab results.  Also please call  Dr. Wert/Pulmonology to follow-up on Chest CT. Dr. [facility] [phone]  Follow-up here in 2 weeks. Continue to social distance and wear a mask when in public.

## 2018-11-24 NOTE — Assessment & Plan Note (Addendum)
195/81 Recheck after laying on L side >10 mins 186/91 HR 70-90s HE HAS NOT TAKEN BP MEDS YET TODAY Continue current dose of Lisinopril and 1/2 tablet Bisoprolol. Take blood pressure medications in the morning. Please check blood pressure and heart rate daily- record. Follow-up here in 2 weeks- bring blood pressure/heart rate log.  He denies acute cardiac sx's He denies increase in dyspnea Discussed Red Flag sx's and to seek immediate medical care if any develop- he verbalized understanding and agreement. Follow-up here in 2 weeks- bring blood pressure/heart rate log.  EKG- no change from previous tracings, NSR

## 2018-11-24 NOTE — Assessment & Plan Note (Signed)
We will call you with your lab results. Follow-up here in 2 weeks. Continue to social distance and wear a mask when in public.

## 2018-11-24 NOTE — Progress Notes (Signed)
Subjective:    Patient ID: William Mouton Sr., male    DOB: [birth year], 68 y.o.   MRN: [id number]  HPI: 09/29/17 OV:  William Hogan is here to establish as a new pt.  He is a pleasant 68 year old male. PMH:  Near syncopal event- 08/2016- antihypertensives were adjusted and he was referred to [facility]- records requested. Per pt, he has had Holter Study, Echo 09/06/16 Echo- Impressions:  - Mild LVH with LVEF 60-65%, grade 1 diastolic dysfunction. Upper normal left atrial chamber size. Calcified mitral annulus with mild mitral regurgitation. Trivial tricuspid regurgitation.  Cards re-started him on Bisoprolol 5mg - 1/2 tablet daily last year. He is also on Lisinopril 20mg  QD He reports taking his medications at bedtime due to SE  Vertigo dx'd 03/2017- treated with Meclizine 25mg  TID PRN  He estimates to drink 40 oz water a day and eats "what's ever is around". He stopped ETOH use 2012 and tobacco use 2018 He denies CP/dyspnea/dizziness/HA/palpiations, he states "I fell fine" He reports his home readings- SBPs 130-140, DBPs 70-90  10/20/17 OV: William Hogan is here for CPE He was seen by [facility] last week and adjustments were made to his anti-hypertensives: Lisinopril increased from 20mg  to 40mg  He was instructed that if home SBP was >140 to take full tablet of bisoprolol 5mg , if SBP <140 then only 1/2 tab of bisoprolol 5mg  He reports home SBP have been consistently running 130 He denies CP/dyspnea/dizzness/HA/palpitations  He has been increasing water and fruit intake He denies formal exercise, but states "I do a lot of walking around" He denies tobacco/ETOH use Reviewed all recent lab results  Healthcare Maintenance: Colonoscopy-pt declined Immunizations-pt declined AAA Screening-pt declined LDCT- order placed Discussed the importance of having screening tests and the risks associated with not completing measures.  02/23/2018 OV: Mr.  Hogan is here for f/u: HTN, HLD, Lung Nodule/Dyspnea He was seen by pulmonology 12/08/17- no evidence of COPD,1st seen on plain cxr 09/06/17 - See CT chest 11/25/17 >rec f/u 6 m LDSCT- Pulmonology will call him to schedule f/u CT - Your lisinopril is the likely cause of your symptoms at present but this does not appear to be severe enough to warrant immediate replacement so I'm going to defer this to your PCP - F/u PRN   Today he denies CP/dypnea and reports "cough isn't really bothering me". He has been trying to check labels of foods for "fat amounts" and has been trying to avoid foods high in saturated fat  He remains quite active with daily walking (unsure of distance) at , and around yard. He continues to abstain from tobacco/ETOH He will wait to hear from Dr. Wert/Pulmology- when to repeat LDCT, f/u with him PRN He will f/u with [facility] May 2020 11/24/2018 OV: William Hogan is here for f/u: HTN, HLD, Pulmonary Nodule He was seen by [facility]- 06/15/2018 via TeleMedicine His reported BP was 133/82 Dr. [employer] did not recommend any changes to current anti-hypertensive therapy- Lisinopril 40mg QD and refilled his Bisoprolol 5mg - he is only taking 1/2 tablet. He reports home BP  SBP 130s DBP 80s He is unsure of his HR, he "thinks in the 90s". He reports using upper arm cuff to check BP, always uses L arm to check BP He denies CP/chest pressure at rest or with exertion. He denies increase in dyspnea. He reports significant anxiety when coming to see providers. Review of last several BPs at various clinics SBP  130-140, consistently  with a few elevated readings  DBP 70-80 HR 70-80 HE HAS NOT TAKEN ANTI-HYPERTENSIVES TODAY He was previously on Clonidine, but was d/c'd due to dizziness.  He needs repeat chest CT  Patient Care Team    Relationship Specialty Notifications Start End  Esaw Grandchild, NP PCP - General Family Medicine   09/29/17   Nigel Mormon, MD Consulting Physician Cardiology  09/29/17   Kathi Ludwig, MD Resident Internal Medicine  09/29/17   Tamsen Roers, MD Referring Physician Family Medicine  08/24/18     Patient Active Problem List   Diagnosis Date Noted  . Mixed hyperlipidemia 06/16/2018  . Solitary pulmonary nodule -  ? hamartoma?  12/08/2017  . Nodule of right lung 11/29/2017  . Healthcare maintenance 10/20/2017  . High cholesterol 09/29/2017  . Vertigo 09/29/2017  . Hyponatremia 09/06/2016  . Essential hypertension 09/06/2016  . Dyspnea on exertion 09/06/2016  . Near syncope 09/05/2016  . Postural dizziness with presyncope 09/05/2016     Past Medical History:  Diagnosis Date  . High cholesterol   . Hypertension      History reviewed. No pertinent surgical history.   Family History  Problem Relation Age of Onset  . Stroke Father      Social History   Substance and Sexual Activity  Drug Use No     Social History   Substance and Sexual Activity  Alcohol Use No   Comment: Quit drinking alcohol 10/20/2010     Social History   Tobacco Use  Smoking Status Former Smoker  . Packs/day: 1.50  . Years: 40.00  . Pack years: 60.00  . Types: Cigarettes  . Quit date: 09/05/2016  . Years since quitting: 2.2  Smokeless Tobacco Never Used     Outpatient Encounter Medications as of 11/24/2018  Medication Sig  . atorvastatin (LIPITOR) 40 MG tablet Take 1 tablet (40 mg total) by mouth daily.  . bisoprolol (ZEBETA) 5 MG tablet Take 0.5 tablets (2.5 mg total) by mouth daily.  Marland Kitchen lisinopril (PRINIVIL,ZESTRIL) 40 MG tablet Take 40 mg by mouth daily.  . meclizine (ANTIVERT) 25 MG tablet Take 1 tablet (25 mg total) by mouth 3 (three) times daily as needed for dizziness.  . [DISCONTINUED] bisoprolol (ZEBETA) 5 MG tablet Take 0.5 tablets (2.5 mg total) by mouth daily.  . [DISCONTINUED] bisoprolol (ZEBETA) 5 MG tablet Take 1 tablet (5 mg total) by mouth daily.   No  facility-administered encounter medications on file as of 11/24/2018.     Allergies: Clonidine derivatives  Body mass index is 27.63 kg/m.  Blood pressure (!) 186/91, pulse 75, temperature 98.5 F (36.9 C), temperature source Oral, height 5\' 11"  (1.803 m), weight 198 lb 1.6 oz (89.9 kg), SpO2 95 %.  Review of Systems  Constitutional: Positive for fatigue. Negative for activity change, appetite change, chills, diaphoresis, fever and unexpected weight change.  Eyes: Negative for visual disturbance.  Respiratory: Negative for cough, chest tightness, shortness of breath and wheezing.   Cardiovascular: Negative for chest pain, palpitations and leg swelling.  Neurological: Negative for dizziness and headaches.  Hematological: Negative for adenopathy. Does not bruise/bleed easily.  Psychiatric/Behavioral: Negative for agitation, behavioral problems, confusion, decreased concentration, dysphoric mood, hallucinations, self-injury, sleep disturbance and suicidal ideas. The patient is not nervous/anxious and is not hyperactive.        Objective:   Physical Exam Vitals signs and nursing note reviewed.  Constitutional:      Appearance: Normal appearance. He is obese.  HENT:  Right Ear: Decreased hearing noted.     Left Ear: Decreased hearing noted.  Cardiovascular:     Rate and Rhythm: Normal rate and regular rhythm.     Pulses: Normal pulses.     Heart sounds: Normal heart sounds.  Pulmonary:     Effort: Pulmonary effort is normal. No respiratory distress.     Breath sounds: Normal breath sounds. No stridor. No wheezing, rhonchi or rales.  Chest:     Chest wall: No tenderness.  Skin:    Capillary Refill: Capillary refill takes less than 2 seconds.  Neurological:     Mental Status: He is alert and oriented to person, place, and time.  Psychiatric:        Mood and Affect: Mood normal.        Behavior: Behavior normal.        Thought Content: Thought content normal.         Judgment: Judgment normal.        Assessment & Plan:   1. Need for influenza vaccination   2. Encounter for Medicare annual wellness exam   3. Essential hypertension   4. Solitary pulmonary nodule -  ? hamartoma?    5. Dyspnea on exertion   6. Healthcare maintenance     Essential hypertension 195/81 Recheck after laying on L side >10 mins 186/91 HR 70-90s HE HAS NOT TAKEN BP MEDS YET TODAY Continue current dose of Lisinopril and 1/2 tablet Bisoprolol. Take blood pressure medications in the morning. Please check blood pressure and heart rate daily- record. Follow-up here in 2 weeks- bring blood pressure/heart rate log.  He denies acute cardiac sx's He denies increase in dyspnea Discussed Red Flag sx's and to seek immediate medical care if any develop- he verbalized understanding and agreement. Follow-up here in 2 weeks- bring blood pressure/heart rate log.  EKG- no change from previous tracings, NSR  Solitary pulmonary nodule -  ? hamartoma?  Please call Dr. Wert/Pulmonology to follow-up on Chest CT. Dr. Eulah PontWert [facility] [phone]  Dyspnea on exertion Stable, no increase   Healthcare maintenance We will call you with your lab results. Follow-up here in 2 weeks. Continue to social distance and wear a mask when in public.     FOLLOW-UP:  Return in about 2 weeks (around 12/08/2018).

## 2018-11-25 LAB — LIPID PANEL
Chol/HDL Ratio: 3.6 ratio (ref 0.0–5.0)
Cholesterol, Total: 153 mg/dL (ref 100–199)
HDL: 43 mg/dL (ref >39)
LDL Chol Calc (NIH): 92 mg/dL (ref 0–99)
Triglycerides: 94 mg/dL (ref 0–149)
VLDL Cholesterol Cal: 18 mg/dL (ref 5–40)

## 2018-11-25 LAB — CBC WITH DIFFERENTIAL/PLATELET
Basophils Absolute: 0.1 10*3/uL (ref 0.0–0.2)
Basos: 1 %
EOS (ABSOLUTE): 0.2 10*3/uL (ref 0.0–0.4)
Eos: 2 %
Hematocrit: 50 % (ref 37.5–51.0)
Hemoglobin: 17.3 g/dL (ref 13.0–17.7)
Immature Grans (Abs): 0 10*3/uL (ref 0.0–0.1)
Immature Granulocytes: 0 %
Lymphocytes Absolute: 2.3 10*3/uL (ref 0.7–3.1)
Lymphs: 22 %
MCH: 28.8 pg (ref 26.6–33.0)
MCHC: 34.6 g/dL (ref 31.5–35.7)
MCV: 83 fL (ref 79–97)
Monocytes Absolute: 0.7 10*3/uL (ref 0.1–0.9)
Monocytes: 6 %
Neutrophils Absolute: 7.3 10*3/uL — ABNORMAL HIGH (ref 1.4–7.0)
Neutrophils: 69 %
Platelets: 328 10*3/uL (ref 150–450)
RBC: 6 x10E6/uL — ABNORMAL HIGH (ref 4.14–5.80)
RDW: 12.7 % (ref 11.6–15.4)
WBC: 10.6 10*3/uL (ref 3.4–10.8)

## 2018-11-25 LAB — COMPREHENSIVE METABOLIC PANEL
ALT: 40 IU/L (ref 0–44)
AST: 37 IU/L (ref 0–40)
Albumin/Globulin Ratio: 1.8 (ref 1.2–2.2)
Albumin: 4.8 g/dL (ref 3.8–4.8)
Alkaline Phosphatase: 112 IU/L (ref 39–117)
BUN/Creatinine Ratio: 12 (ref 10–24)
BUN: 15 mg/dL (ref 8–27)
Bilirubin Total: 0.9 mg/dL (ref 0.0–1.2)
CO2: 23 mmol/L (ref 20–29)
Calcium: 10.1 mg/dL (ref 8.6–10.2)
Chloride: 100 mmol/L (ref 96–106)
Creatinine, Ser: 1.21 mg/dL (ref 0.76–1.27)
GFR calc Af Amer: 70 mL/min/{1.73_m2} (ref >59)
GFR calc non Af Amer: 61 mL/min/{1.73_m2} (ref >59)
Globulin, Total: 2.7 g/dL (ref 1.5–4.5)
Glucose: 114 mg/dL — ABNORMAL HIGH (ref 65–99)
Potassium: 4.6 mmol/L (ref 3.5–5.2)
Sodium: 136 mmol/L (ref 134–144)
Total Protein: 7.5 g/dL (ref 6.0–8.5)

## 2018-11-25 LAB — HEMOGLOBIN A1C
Est. average glucose Bld gHb Est-mCnc: 120 mg/dL
Hgb A1c MFr Bld: 5.8 % — ABNORMAL HIGH (ref 4.8–5.6)

## 2018-11-25 LAB — TSH: TSH: 2.11 u[IU]/mL (ref 0.450–4.500)

## 2018-11-28 LAB — EKG 12-LEAD

## 2018-12-08 ENCOUNTER — Ambulatory Visit: Payer: Medicare Other | Admitting: Adult Health

## 2018-12-09 ENCOUNTER — Telehealth: Payer: Self-pay | Admitting: Adult Health

## 2018-12-09 NOTE — Telephone Encounter (Signed)
Called the patient x3 at number listed to r/s 2 wk f/u due to power outage, only rings with no option to leave a VM

## 2018-12-09 NOTE — Progress Notes (Addendum)
Subjective:    Patient ID: William MoutonMichael A Pindell Hogan., male    DOB: 1950/04/05, 68 y.o.   MRN: [id number]  HPI:09/29/17 OV: William Hogan is here to establish as a new pt. He is a pleasant 68 year old male. PMH: Near syncopal event- 08/2016- antihypertensives were adjusted and he was referred to [facility]- records requested. Per pt, he has had Holter Study, Echo 09/06/16 Echo- Impressions:  - Mild LVH with LVEF 60-65%, grade 1 diastolic dysfunction. Upper normal left atrial chamber size. Calcified mitral annulus with mild mitral regurgitation. Trivial tricuspid regurgitation.  Cards re-started him on Bisoprolol 5mg - 1/2 tablet daily last year. He is also on Lisinopril 20mg  QD He reports taking his medications at bedtime due to SE  Vertigo dx'd 03/2017- treated with Meclizine 25mg  TID PRN  He estimates to drink 40 oz water a day and eats "what's ever is around". He stopped ETOH use 2012 and tobacco use 2018 He denies CP/dyspnea/dizziness/HA/palpiations, he states "I fell fine" He reports his home readings- SBPs 130-140, DBPs 70-90  10/20/17 OV: William Hogan is here for CPE He was [facility] by [facility] last week and adjustments were made to his anti-hypertensives: Lisinopril increased from 20mg  to 40mg  He was instructed that if home SBP was >140 to take full tablet of bisoprolol 5mg , if SBP <140 then only 1/2 tab of bisoprolol 5mg  He reports home SBP have been consistently running 130 He denies CP/dyspnea/dizzness/HA/palpitations  He has been increasing water and fruit intake He denies formal exercise, but states "I do a lot of walking around" He denies tobacco/ETOH use Reviewed all recent lab results  Healthcare Maintenance: Colonoscopy-pt declined Immunizations-pt declined AAA Screening-pt declined LDCT- order placed Discussed the importance of having screening tests and the risks associated with not completing measures.  02/23/2018 OV: William Hogan  is here for f/u: HTN, HLD, Lung Nodule/Dyspnea He was seen by pulmonology 12/08/17- no evidence of COPD,1st seen on plain cxr 09/06/17 - See CT chest 11/25/17 >rec f/u 6 m LDSCT- Pulmonology will call him to schedule f/u CT -Your lisinopril is the likely cause of your symptoms at present but this does not appear to be severe enough to warrant immediate replacement so I'm going to defer this to your PCP - F/u PRN  Today he denies CP/dypnea and reports "cough isn't really bothering me". He has been trying to check labels of foods for "fat amounts" and has been trying to avoid foods high in saturated fat  He remains quite active with daily walking (unsure of distance) at [employer], [employer] and around yard. He continues to abstain from tobacco/ETOH He will wait to hear from Dr. Wert/Pulmology- when to repeat LDCT, f/u with him PRN He will f[facility] May 2020 11/24/2018 OV: William Hogan is here for f/u: HTN, HLD, Pulmonary Nodule He was seen by [facility]- 06/15/2018 via TeleMedicine His reported BP was 133/82 Dr. Polo RileyParwardhan did not recommend any changes to current anti-hypertensive therapy- Lisinopril 40mg QD and refilled his Bisoprolol 5mg - he is only taking 1/2 tablet. He reports home BP  SBP 130s DBP 80s He is unsure of his HR, he "thinks in the 90s". He reports using upper arm cuff to check BP, always uses L arm to check BP He denies CP/chest pressure at rest or with exertion. He denies increase in dyspnea. He reports significant anxiety when coming to see providers. Review of last several BPs at various clinics SBP  130-140, consistently with a few elevated readings  DBP 70-80 HR 70-80 HE HAS NOT TAKEN ANTI-HYPERTENSIVES TODAY He was previously on Clonidine, but was d/c'd due to dizziness.  He needs repeat chest CT   12/12/2018 OV: William Hogan presents for f/u: uncontrolled BP Ambulatory readings- SBP 130-150, mean upper 140s DBP  70-80 HR low 60s- 80, mean 60s- will not increase BB due to HR He denies any acute cardiac sx's. He reports being able to walk up a flight up stairs without CP/chest tightness. He remains of tobacco/ETOH 09/06/2016 ECHO- - Mild LVH with LVEF 16-96%, grade 1 diastolic dysfunction. Upper   normal left atrial chamber size. Calcified mitral annulus with   mild mitral regurgitation. Trivial tricuspid regurgitation.  11/24/2018 Labs: The 10-year ASCVD risk score William Hogan DC Brooke Hogan., et al., 2013) is: 32.5%  Values used to calculate the score:   Age: 42 years   Sex: Male   Is Non-Hispanic African American: No   Diabetic: No   Tobacco smoker: No   Systolic Blood Pressure: 789 mmHg   Is BP treated: Yes   HDL Cholesterol: 43 mg/dL   Total Cholesterol: 153 mg/dL  LDL-92  Opt ASCVD is 12  He is agreeable to increasing statin- need to check hepatic fx panel in 6 weeks, lipids 3 months  Patient Care Team    Relationship Specialty Notifications Start End  Esaw Grandchild, NP PCP - General Family Medicine  09/29/17   Nigel Mormon, MD Consulting Physician Cardiology  09/29/17   Kathi Ludwig, MD Resident Internal Medicine  09/29/17   Tamsen Roers, MD Referring Physician Family Medicine  08/24/18     Patient Active Problem List   Diagnosis Date Noted  . Mixed hyperlipidemia 06/16/2018  . Solitary pulmonary nodule -  ? hamartoma?  12/08/2017  . Nodule of right lung 11/29/2017  . Healthcare maintenance 10/20/2017  . High cholesterol 09/29/2017  . Vertigo 09/29/2017  . Hyponatremia 09/06/2016  . Essential hypertension 09/06/2016  . Dyspnea on exertion 09/06/2016  . Near syncope 09/05/2016  . Postural dizziness with presyncope 09/05/2016     Past Medical History:  Diagnosis Date  . High cholesterol   . Hypertension      History reviewed. No pertinent surgical history.   Family History  Problem Relation Age of Onset  . Stroke Father       Social History   Substance and Sexual Activity  Drug Use No     Social History   Substance and Sexual Activity  Alcohol Use No   Comment: Quit drinking alcohol 10/20/2010     Social History   Tobacco Use  Smoking Status Former Smoker  . Packs/day: 1.50  . Years: 40.00  . Pack years: 60.00  . Types: Cigarettes  . Quit date: 09/05/2016  . Years since quitting: 2.2  Smokeless Tobacco Never Used     Outpatient Encounter Medications as of 12/12/2018  Medication Sig  . atorvastatin (LIPITOR) 80 MG tablet Take 1 tablet (80 mg total) by mouth daily.  . bisoprolol (ZEBETA) 5 MG tablet Take 0.5 tablets (2.5 mg total) by mouth daily.  Marland Kitchen lisinopril (PRINIVIL,ZESTRIL) 40 MG tablet Take 40 mg by mouth daily.  . meclizine (ANTIVERT) 25 MG tablet Take 1 tablet (25 mg total) by mouth 3 (three) times daily as needed for dizziness.  . [DISCONTINUED] atorvastatin (LIPITOR) 40 MG tablet Take 1 tablet (40 mg total) by mouth daily.  Marland Kitchen amLODipine (NORVASC) 2.5 MG tablet Take 1 tablet (2.5 mg total) by mouth daily.   No facility-administered encounter  medications on file as of 12/12/2018.     Allergies: Clonidine derivatives  Body mass index is 28.52 kg/m.  Blood pressure (!) 189/73, pulse 68, temperature 98.7 F (37.1 C), temperature source Oral, height 5\' 11"  (1.803 m), weight 204 lb 8 oz (92.8 kg), SpO2 96 %.     Review of Systems  Constitutional: Positive for fatigue. Negative for activity change, appetite change, chills, diaphoresis, fever and unexpected weight change.  Eyes: Negative for visual disturbance.  Respiratory: Negative for cough, chest tightness, shortness of breath, wheezing and stridor.   Cardiovascular: Negative for chest pain, palpitations and leg swelling.  Endocrine: Negative for polydipsia, polyphagia and polyuria.  Skin: Negative for color change, pallor, rash and wound.  Neurological: Negative for dizziness and headaches.  Hematological: Negative for  adenopathy. Does not bruise/bleed easily.       Objective:   Physical Exam Vitals signs and nursing note reviewed.  Constitutional:      General: He is not in acute distress.    Appearance: Normal appearance. He is not ill-appearing, toxic-appearing or diaphoretic.  HENT:     Head: Normocephalic and atraumatic.  Neck:     Vascular: No carotid bruit.  Cardiovascular:     Rate and Rhythm: Normal rate and regular rhythm.     Pulses: Normal pulses.     Heart sounds: Normal heart sounds. No murmur. No friction rub. No gallop.   Skin:    General: Skin is warm.     Capillary Refill: Capillary refill takes less than 2 seconds.     Coloration: Skin is pale.  Neurological:     Mental Status: He is alert and oriented to person, place, and time.  Psychiatric:        Mood and Affect: Mood normal.        Behavior: Behavior normal.        Thought Content: Thought content normal.        Judgment: Judgment normal.       Assessment & Plan:   1. Mixed hyperlipidemia   2. Essential hypertension   3. On statin therapy   4. High cholesterol     Essential hypertension 1) due to elevated blood pressure- please start once daily amlodipine 2.5mg , in addition to two other blood pressure medications. 2) continue to check blood pressure and heart rate daily- record. 3) call this clinic with blood pressure and hear rate info in 2 weeks. 4) Follow-up with your Cardiologist- take your blood pressure/heart rate log with you to you appt at your cardiologist , MD [facility]. [place] Pager: [phone] Office: [phone] If no answer Cell [phone] 5) Increased Atorvastatin from 40mg  to 80mg  once daily. 6) please schedule non-fasting lab appt in 6 weeks, re: check liver function. 7) please schedule office visit in 3 months, come fasting so that we can re-check cholesterol level. 8) If you develop any severe chest pain/chest pressure- seek immediate medical  care. Continue to social distance and wear a mask when in public.  High cholesterol 11/24/2018 Labs: The 10-year ASCVD risk score DC ., et al., 2013) is: 32.5%  Values used to calculate the score:   Age: 12 years   Sex: Male   Is Non-Hispanic African American: No   Diabetic: No   Tobacco smoker: No   Systolic Blood Pressure: 186 mmHg   Is BP treated: Yes   HDL Cholesterol: 43 mg/dL   Total Cholesterol: William Hogan mg/dL  2014  Opt ASCVD is 12  He  is agreeable to increasing statin-  Increased Atorvastatin from  to   Need to check hepatic fx panel in 6 weeks, lipids 3 months    FOLLOW-UP:  Return in about 3 months (around 03/14/2019) for HTN, Regular Follow Up, Hypercholestermia, Fasting Labs.

## 2018-12-12 ENCOUNTER — Ambulatory Visit (INDEPENDENT_AMBULATORY_CARE_PROVIDER_SITE_OTHER): Payer: Medicare Other | Admitting: Adult Health

## 2018-12-12 ENCOUNTER — Encounter: Payer: Self-pay | Admitting: Adult Health

## 2018-12-12 ENCOUNTER — Other Ambulatory Visit: Payer: Self-pay

## 2018-12-12 VITALS — BP 189/73 | HR 68 | Temp 98.7°F | Ht 71.0 in | Wt 204.5 lb

## 2018-12-12 DIAGNOSIS — E782 Mixed hyperlipidemia: Secondary | ICD-10-CM | POA: Diagnosis not present

## 2018-12-12 DIAGNOSIS — E7800 Pure hypercholesterolemia, unspecified: Secondary | ICD-10-CM

## 2018-12-12 DIAGNOSIS — Z79899 Other long term (current) drug therapy: Secondary | ICD-10-CM

## 2018-12-12 DIAGNOSIS — I10 Essential (primary) hypertension: Secondary | ICD-10-CM | POA: Diagnosis not present

## 2018-12-12 MED ORDER — AMLODIPINE BESYLATE 2.5 MG PO TABS: 2.5000 mg | ORAL_TABLET | Freq: Every day | ORAL | 1 refills | Status: DC

## 2018-12-12 MED ORDER — ATORVASTATIN CALCIUM 80 MG PO TABS: 80.0000 mg | ORAL_TABLET | Freq: Every day | ORAL | 1 refills | Status: DC

## 2018-12-12 NOTE — Patient Instructions (Addendum)
DASH Eating Plan DASH stands for "Dietary Approaches to Stop Hypertension." The DASH eating plan is a healthy eating plan that has been shown to reduce high blood pressure (hypertension). It may also reduce your risk for type 2 diabetes, heart disease, and stroke. The DASH eating plan may also help with weight loss. What are tips for following this plan?  General guidelines  Avoid eating more than 2,300 mg (milligrams) of salt (sodium) a day. If you have hypertension, you may need to reduce your sodium intake to 1,500 mg a day.  Limit alcohol intake to no more than 1 drink a day for nonpregnant women and 2 drinks a day for men. One drink equals 12 oz of beer, 5 oz of wine, or 1 oz of hard liquor.  Work with your health care provider to maintain a healthy body weight or to lose weight. Ask what an ideal weight is for you.  Get at least 30 minutes of exercise that causes your heart to beat faster (aerobic exercise) most days of the week. Activities may include walking, swimming, or biking.  Work with your health care provider or diet and nutrition specialist (dietitian) to adjust your eating plan to your individual calorie needs. Reading food labels   Check food labels for the amount of sodium per serving. Choose foods with less than 5 percent of the Daily Value of sodium. Generally, foods with less than 300 mg of sodium per serving fit into this eating plan.  To find whole grains, look for the word "whole" as the first word in the ingredient list. Shopping  Buy products labeled as "low-sodium" or "no salt added."  Buy fresh foods. Avoid canned foods and premade or frozen meals. Cooking  Avoid adding salt when cooking. Use salt-free seasonings or herbs instead of table salt or sea salt. Check with your health care provider or pharmacist before using salt substitutes.  Do not fry foods. Cook foods using healthy methods such as baking, boiling, grilling, and broiling instead.  Cook with  heart-healthy oils, such as olive, canola, soybean, or sunflower oil. Meal planning  Eat a balanced diet that includes: ? 5 or more servings of fruits and vegetables each day. At each meal, try to fill half of your plate with fruits and vegetables. ? Up to 6-8 servings of whole grains each day. ? Less than 6 oz of lean meat, poultry, or fish each day. A 3-oz serving of meat is about the same size as a deck of cards. One egg equals 1 oz. ? 2 servings of low-fat dairy each day. ? A serving of nuts, seeds, or beans 5 times each week. ? Heart-healthy fats. Healthy fats called Omega-3 fatty acids are found in foods such as flaxseeds and coldwater fish, like sardines, salmon, and mackerel.  Limit how much you eat of the following: ? Canned or prepackaged foods. ? Food that is high in trans fat, such as fried foods. ? Food that is high in saturated fat, such as fatty meat. ? Sweets, desserts, sugary drinks, and other foods with added sugar. ? Full-fat dairy products.  Do not salt foods before eating.  Try to eat at least 2 vegetarian meals each week.  Eat more home-cooked food and less restaurant, buffet, and fast food.  When eating at a restaurant, ask that your food be prepared with less salt or no salt, if possible. What foods are recommended? The items listed may not be a complete list. Talk with your dietitian about   what dietary choices are best for you. Grains Whole-grain or whole-wheat bread. Whole-grain or whole-wheat pasta. Brown rice. Oatmeal. Quinoa. Bulgur. Whole-grain and low-sodium cereals. Pita bread. Low-fat, low-sodium crackers. Whole-wheat flour tortillas. Vegetables Fresh or frozen vegetables (raw, steamed, roasted, or grilled). Low-sodium or reduced-sodium tomato and vegetable juice. Low-sodium or reduced-sodium tomato sauce and tomato paste. Low-sodium or reduced-sodium canned vegetables. Fruits All fresh, dried, or frozen fruit. Canned fruit in natural juice (without  added sugar). Meat and other protein foods Skinless chicken or turkey. Ground chicken or turkey. Pork with fat trimmed off. Fish and seafood. Egg whites. Dried beans, peas, or lentils. Unsalted nuts, nut butters, and seeds. Unsalted canned beans. Lean cuts of beef with fat trimmed off. Low-sodium, lean deli meat. Dairy Low-fat (1%) or fat-free (skim) milk. Fat-free, low-fat, or reduced-fat cheeses. Nonfat, low-sodium ricotta or cottage cheese. Low-fat or nonfat yogurt. Low-fat, low-sodium cheese. Fats and oils Soft margarine without trans fats. Vegetable oil. Low-fat, reduced-fat, or light mayonnaise and salad dressings (reduced-sodium). Canola, safflower, olive, soybean, and sunflower oils. Avocado. Seasoning and other foods Herbs. Spices. Seasoning mixes without salt. Unsalted popcorn and pretzels. Fat-free sweets. What foods are not recommended? The items listed may not be a complete list. Talk with your dietitian about what dietary choices are best for you. Grains Baked goods made with fat, such as croissants, muffins, or some breads. Dry pasta or rice meal packs. Vegetables Creamed or fried vegetables. Vegetables in a cheese sauce. Regular canned vegetables (not low-sodium or reduced-sodium). Regular canned tomato sauce and paste (not low-sodium or reduced-sodium). Regular tomato and vegetable juice (not low-sodium or reduced-sodium). Pickles. Olives. Fruits Canned fruit in a light or heavy syrup. Fried fruit. Fruit in cream or butter sauce. Meat and other protein foods Fatty cuts of meat. Ribs. Fried meat. Bacon. Sausage. Bologna and other processed lunch meats. Salami. Fatback. Hotdogs. Bratwurst. Salted nuts and seeds. Canned beans with added salt. Canned or smoked fish. Whole eggs or egg yolks. Chicken or turkey with skin. Dairy Whole or 2% milk, cream, and half-and-half. Whole or full-fat cream cheese. Whole-fat or sweetened yogurt. Full-fat cheese. Nondairy creamers. Whipped toppings.  Processed cheese and cheese spreads. Fats and oils Butter. Stick margarine. Lard. Shortening. Ghee. Bacon fat. Tropical oils, such as coconut, palm kernel, or palm oil. Seasoning and other foods Salted popcorn and pretzels. Onion salt, garlic salt, seasoned salt, table salt, and sea salt. Worcestershire sauce. Tartar sauce. Barbecue sauce. Teriyaki sauce. Soy sauce, including reduced-sodium. Steak sauce. Canned and packaged gravies. Fish sauce. Oyster sauce. Cocktail sauce. Horseradish that you find on the shelf. Ketchup. Mustard. Meat flavorings and tenderizers. Bouillon cubes. Hot sauce and Tabasco sauce. Premade or packaged marinades. Premade or packaged taco seasonings. Relishes. Regular salad dressings. Where to find more information:  National Heart, Lung, and Blood Institute: www.nhlbi.nih.gov  American Heart Association: www.heart.org Summary  The DASH eating plan is a healthy eating plan that has been shown to reduce high blood pressure (hypertension). It may also reduce your risk for type 2 diabetes, heart disease, and stroke.  With the DASH eating plan, you should limit salt (sodium) intake to 2,300 mg a day. If you have hypertension, you may need to reduce your sodium intake to 1,500 mg a day.  When on the DASH eating plan, aim to eat more fresh fruits and vegetables, whole grains, lean proteins, low-fat dairy, and heart-healthy fats.  Work with your health care provider or diet and nutrition specialist (dietitian) to adjust your eating plan to your   individual calorie needs. This information is not intended to replace advice given to you by your health care provider. Make sure you discuss any questions you have with your health care provider. Document Released: 01/15/2011 Document Revised: 01/08/2017 Document Reviewed: 01/20/2016 Elsevier Patient Education  2020 [place].   1) due to elevated blood pressure- please start once daily amlodipine 2.5mg , in addition to two other  blood pressure medications. 2) continue to check blood pressure and heart rate daily- record. 3) call this clinic with blood pressure and hear rate info in 2 weeks. 4) Follow-up with your Cardiologist- take your blood pressure/heart rate log with you to you appt at your cardiologist Nigel Mormon, MD [facility]. [place] Pager: [phone] Office: [phone] If no answer Cell [phone] 5) Increased Atorvastatin from 40mg  to 80mg  once daily. 6) please schedule non-fasting lab appt in 6 weeks, re: check liver function. 7) please schedule office visit in 3 months, come fasting so that we can re-check cholesterol level. 8) If you develop any severe chest pain/chest pressure- seek immediate medical care. Continue to social distance and wear a mask when in public.

## 2018-12-12 NOTE — Assessment & Plan Note (Addendum)
11/24/2018 Labs: The 10-year ASCVD risk score William Hogan William Hogan., et al., 2013) is: 32.5%  Values used to calculate the score:   Age: 68 years   Sex: Male   Is Non-Hispanic African American: No   Diabetic: No   Tobacco smoker: No   Systolic Blood Pressure: 992 mmHg   Is BP treated: Yes   HDL Cholesterol: 43 mg/dL   Total Cholesterol: 153 mg/dL  LDL-92  Opt ASCVD is 12  He is agreeable to increasing statin-  Increased Atorvastatin from 40mg  to 80mg   Need to check hepatic fx panel in 6 weeks, lipids 3 months

## 2018-12-12 NOTE — Assessment & Plan Note (Signed)
1) due to elevated blood pressure- please start once daily amlodipine 2.5mg , in addition to two other blood pressure medications. 2) continue to check blood pressure and heart rate daily- record. 3) call this clinic with blood pressure and hear rate info in 2 weeks. 4) Follow-up with your Cardiologist- take your blood pressure/heart rate log with you to you appt at your cardiologist Nigel Mormon, MD [facility]. [place] Pager: [phone] Office: [phone] If no answer Cell [phone] 5) Increased Atorvastatin from 40mg  to 80mg  once daily. 6) please schedule non-fasting lab appt in 6 weeks, re: check liver function. 7) please schedule office visit in 3 months, come fasting so that we can re-check cholesterol level. 8) If you develop any severe chest pain/chest pressure- seek immediate medical care. Continue to social distance and wear a mask when in public.

## 2018-12-12 NOTE — Assessment & Plan Note (Signed)
>>  ASSESSMENT AND PLAN FOR HIGH CHOLESTEROL WRITTEN ON 12/12/2018  7:49 PM BY DANFORD, KATY D, NP  11/24/2018 Labs: The 10-year ASCVD risk score Denman George DC Montez Hageman., et al., 2013) is: 32.5%  Values used to calculate the score:   Age: 68 years   Sex: Male   Is Non-Hispanic African American: No   Diabetic: No   Tobacco smoker: No   Systolic Blood Pressure: 186 mmHg   Is BP treated: Yes   HDL Cholesterol: 43 mg/dL   Total Cholesterol: 161 mg/dL  WRU-04  Opt ASCVD is 12  He is agreeable to increasing statin-  Increased Atorvastatin from 40mg  to 80mg   Need to check hepatic fx panel in 6 weeks, lipids 3 months

## 2018-12-14 ENCOUNTER — Telehealth: Payer: Self-pay | Admitting: Adult Health

## 2018-12-14 NOTE — Telephone Encounter (Signed)
Rcvd call from [facility] stating no record of pt receiving Tdap vaccine w/them--  --Forwarding update to med asst.  --glh

## 2018-12-15 ENCOUNTER — Other Ambulatory Visit: Payer: Self-pay

## 2018-12-15 ENCOUNTER — Encounter: Payer: Self-pay | Admitting: Cardiology

## 2018-12-15 ENCOUNTER — Ambulatory Visit (INDEPENDENT_AMBULATORY_CARE_PROVIDER_SITE_OTHER): Payer: Medicare Other | Admitting: Cardiology

## 2018-12-15 VITALS — BP 174/106 | HR 106 | Temp 98.0°F | Ht 71.0 in | Wt 200.0 lb

## 2018-12-15 DIAGNOSIS — I10 Essential (primary) hypertension: Secondary | ICD-10-CM | POA: Diagnosis not present

## 2018-12-15 DIAGNOSIS — E782 Mixed hyperlipidemia: Secondary | ICD-10-CM | POA: Diagnosis not present

## 2018-12-15 MED ORDER — AMLODIPINE BESYLATE 5 MG PO TABS: 5.0000 mg | ORAL_TABLET | Freq: Every day | ORAL | 2 refills | Status: DC

## 2018-12-15 MED ORDER — BISOPROLOL FUMARATE 5 MG PO TABS: 5.0000 mg | ORAL_TABLET | Freq: Every day | ORAL | 2 refills | Status: DC

## 2018-12-15 MED ORDER — AMLODIPINE BESYLATE 5 MG PO TABS: 2.5000 mg | ORAL_TABLET | Freq: Every day | ORAL | 2 refills | Status: DC

## 2018-12-15 NOTE — Progress Notes (Signed)
Subjective:   William MoutonMichael A Schiavo Sr., male    DOB: 1950-02-12, 68 y.o.   MRN: [id number]   Chief complaint:  Hypertension  HPI  68 year old Caucasian male with hypertension, hyperlipidemia.  Patient recently saw his PCP, who started amlodipine 2.5 mg daily given elevated blood pressure, and increased lipitor from 40 to 80 given LDL of 92.   Blood pressure remains elevated today.  His home checks suggest much lower blood pressure readings.  He denies any chest pain, shortness of breath, vision changes, headache, nausea, vomiting.  Past Medical History:  Diagnosis Date  . High cholesterol   . Hypertension      History reviewed. No pertinent surgical history.   Social History   Socioeconomic History  . Marital status: Married    Spouse name: Not on file  . Number of children: 3  . Years of education: Not on file  . Highest education level: Not on file  Occupational History  . Not on file  Social Needs  . Financial resource strain: Not on file  . Food insecurity    Worry: Not on file    Inability: Not on file  . Transportation needs    Medical: Not on file    Non-medical: Not on file  Tobacco Use  . Smoking status: Former Smoker    Packs/day: 1.50    Years: 40.00    Pack years: 60.00    Types: Cigarettes    Quit date: 09/05/2016    Years since quitting: 2.2  . Smokeless tobacco: Never Used  Substance and Sexual Activity  . Alcohol use: No    Comment: Quit drinking alcohol 10/20/2010  . Drug use: No  . Sexual activity: Not Currently  Lifestyle  . Physical activity    Days per week: Not on file    Minutes per session: Not on file  . Stress: Not on file  Relationships  . Social Musicianconnections    Talks on phone: Not on file    Gets together: Not on file    Attends religious service: Not on file    Active member of club or organization: Not on file    Attends meetings of clubs or organizations: Not on file    Relationship status: Not on file  . Intimate  partner violence    Fear of current or ex partner: Not on file    Emotionally abused: Not on file    Physically abused: Not on file    Forced sexual activity: Not on file  Other Topics Concern  . Not on file  Social History Narrative  . Not on file     Family History  Problem Relation Age of Onset  . Stroke Father      Current Outpatient Medications on File Prior to Visit  Medication Sig Dispense Refill  . amLODipine (NORVASC) 2.5 MG tablet Take 1 tablet (2.5 mg total) by mouth daily. 30 tablet 1  . atorvastatin (LIPITOR) 80 MG tablet Take 1 tablet (80 mg total) by mouth daily. 90 tablet 1  . bisoprolol (ZEBETA) 5 MG tablet Take 0.5 tablets (2.5 mg total) by mouth daily. 45 tablet 2  . lisinopril (PRINIVIL,ZESTRIL) 40 MG tablet Take 40 mg by mouth daily.    . meclizine (ANTIVERT) 25 MG tablet Take 1 tablet (25 mg total) by mouth 3 (three) times daily as needed for dizziness. 30 tablet 0   No current facility-administered medications on file prior to visit.     Cardiovascular  studies:  EKG 06/10/2017: Sinus rhythm 83 bpm. Poor R wave progression, otherwise normal EKG  MRI Brain 03/2017: IMPRESSION: 1. No acute intracranial abnormality. 2. Age-related cerebral atrophy with mild chronic small vessel ischemic disease. 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  Echocardiogram 09/05/2016:  - Left ventricle: The cavity size was normal. Wall thickness was   increased in a pattern of mild LVH. Systolic function was normal.   The estimated ejection fraction was in the range of 50% to 55%. - Aortic valve: Trileaflet; mildly calcified leaflets. - Mitral valve: Calcified annulus. There was mild regurgitation. - Left atrium: The atrium was at the upper limits of normal in   size. - Right atrium: Central venous pressure (est): 3 mm Hg. - Atrial septum: No defect or patent foramen ovale was identified. - Tricuspid valve: There was trivial regurgitation. - Pulmonary  arteries: Systolic pressure could not be accurately   estimated. - Pericardium, extracardiac: A prominent pericardial fat pad was   present. There was no pericardial effusion.   Impressions:   - Mild LVH with LVEF 61-95%, grade 1 diastolic dysfunction. Upper   normal left atrial chamber size. Calcified mitral annulus with   mild mitral regurgitation. Trivial tricuspid regurgitation.  Lexiscan myoview stress test 11/09/2016: 1. Stress EKG is non diagnostic for ischemia due to pharmacologic    stress testing.    2. Perfusion imaging study demonstrates no evidence of ischemia with normal LV systolic function, calculated EF 64% without regional wall motion abnormality. Overall low risk study.  Event monitor 10/16/2016- 10/30/2016: Sins rhythm w/PAC's SInus tachycardia Sinus bradycardia 32 bpm with rate dependent aberrancy (3:00 AM) Junctional tachcyardia with rate dependent aberrancy    Recent labs: Results for MARQUIS, DOWN SR. (MRN [id number]) as of 12/15/2018 10:10  Ref. Range 11/24/2018 10:49  Sodium Latest Ref Range: 134 - 144 mmol/L 136  Potassium Latest Ref Range: 3.5 - 5.2 mmol/L 4.6  Chloride Latest Ref Range: 96 - 106 mmol/L 100  CO2 Latest Ref Range: 20 - 29 mmol/L 23  Glucose Latest Ref Range: 65 - 99 mg/dL 114 (H)  BUN Latest Ref Range: 8 - 27 mg/dL 15  Creatinine Latest Ref Range: 0.76 - 1.27 mg/dL 1.21  Calcium Latest Ref Range: 8.6 - 10.2 mg/dL 10.1  BUN/Creatinine Ratio Latest Ref Range: 10 - 24  12  Alkaline Phosphatase Latest Ref Range: 39 - 117 IU/L 112  Albumin Latest Ref Range: 3.8 - 4.8 g/dL 4.8  Albumin/Globulin Ratio Latest Ref Range: 1.2 - 2.2  1.8  AST Latest Ref Range: 0 - 40 IU/L 37  ALT Latest Ref Range: 0 - 44 IU/L 40  Total Protein Latest Ref Range: 6.0 - 8.5 g/dL 7.5  Total Bilirubin Latest Ref Range: 0.0 - 1.2 mg/dL 0.9  GFR, Est Non African American Latest Ref Range: >59 mL/min/1.73 61  GFR, Est African American Latest Ref Range: >59  mL/min/1.73 70  Total CHOL/HDL Ratio Latest Ref Range: 0.0 - 5.0 ratio 3.6  Cholesterol, Total Latest Ref Range: 100 - 199 mg/dL 153  HDL Cholesterol Latest Ref Range: >39 mg/dL 43  Triglycerides Latest Ref Range: 0 - 149 mg/dL 94  VLDL Cholesterol Cal Latest Ref Range: 5 - 40 mg/dL 18  LDL Chol Calc (NIH) Latest Ref Range: 0 - 99 mg/dL 92   Results for Pulley, Jrake A SR. (MRN [id number]) as of 12/15/2018 10:10  Ref. Range 11/24/2018 10:49  WBC Latest Ref Range: 3.4 - 10.8 x10E3/uL 10.6  RBC Latest Ref Range:  4.14 - 5.80 x10E6/uL 6.00 (H)  Hemoglobin Latest Ref Range: 13.0 - 17.7 g/dL 59.1  HCT Latest Ref Range: 37.5 - 51.0 % 50.0  MCV Latest Ref Range: 79 - 97 fL 83  MCH Latest Ref Range: 26.6 - 33.0 pg 28.8  MCHC Latest Ref Range: 31.5 - 35.7 g/dL 63.8  RDW Latest Ref Range: 11.6 - 15.4 % 12.7  Platelets Latest Ref Range: 150 - 450 x10E3/uL 328   Review of Systems  Constitution: Negative for decreased appetite, malaise/fatigue, weight gain and weight loss.  HENT: Negative for congestion.   Eyes: Negative for visual disturbance.  Cardiovascular: Negative for chest pain, dyspnea on exertion, leg swelling, palpitations and syncope.  Respiratory: Negative for shortness of breath.   Endocrine: Negative for cold intolerance.  Hematologic/Lymphatic: Does not bruise/bleed easily.  Skin: Negative for itching and rash.  Musculoskeletal: Negative for myalgias.  Gastrointestinal: Negative for abdominal pain, nausea and vomiting.  Genitourinary: Negative for dysuria.  Neurological: Negative for dizziness and weakness.  Psychiatric/Behavioral: The patient is not nervous/anxious.   All other systems reviewed and are negative.       Vitals:   12/15/18 1319 12/15/18 1349  BP: (!) 204/104 (!) 174/106  Pulse: 91 (!) 106  Temp: 98 F (36.7 C)   SpO2: 94%     Physical Exam: Physical Exam  Constitutional: He is oriented to person, place, and time. He appears well-developed and  well-nourished. No distress.  HENT:  Head: Normocephalic and atraumatic.  Eyes: Pupils are equal, round, and reactive to light. Conjunctivae are normal.  Neck: No JVD present.  Cardiovascular: Normal rate, regular rhythm and intact distal pulses.  Pulmonary/Chest: Effort normal and breath sounds normal. He has no wheezes. He has no rales.  Abdominal: Soft. Bowel sounds are normal. There is no rebound.  Musculoskeletal:        General: No edema.  Lymphadenopathy:    He has no cervical adenopathy.  Neurological: He is alert and oriented to person, place, and time. No cranial nerve deficit.  Skin: Skin is warm and dry.  Psychiatric: He has a normal mood and affect.  Nursing note and vitals reviewed.       Assessment & Recommendations:  68 year old Caucasian male with hypertension, hyperlipidemia, vertigo.  1. Essential hypertension Continue lisinopril 40 mg daily. Increase amlodipine to 5 mg daily, and bisoprolol to 5 mg daily.  He has follow-up and work-up scheduled with PCP over the next few weeks.  I will see him back in 8 weeks.  If hypertension remains resistant, could consider secondary hypertension work-up, including renal artery stenosis.  2. Mixed hyperlipidemia Agree with lipitor increase from 40 to 80 mg daily. F/u lipid panel in 3 months with PCP is scheduled.  Follow-up in 8 weeks.   Elder Negus, MD [facility] [place] Pager: [phone] Office: [phone] If no answer Cell [phone]

## 2019-01-02 ENCOUNTER — Telehealth: Payer: Self-pay

## 2019-01-02 NOTE — Telephone Encounter (Signed)
Pt brought blood pressure and HR readings by the office on 12/29/2018.  Per Mina Marble, NP "overall numbers are at goal".  Pt is followed by cards. Charyl Bigger, CMA

## 2019-01-23 ENCOUNTER — Other Ambulatory Visit (INDEPENDENT_AMBULATORY_CARE_PROVIDER_SITE_OTHER): Payer: Medicare Other

## 2019-01-23 ENCOUNTER — Other Ambulatory Visit: Payer: Self-pay

## 2019-01-23 DIAGNOSIS — E782 Mixed hyperlipidemia: Secondary | ICD-10-CM

## 2019-01-23 DIAGNOSIS — Z79899 Other long term (current) drug therapy: Secondary | ICD-10-CM

## 2019-01-24 LAB — LIPID PANEL
Chol/HDL Ratio: 3.7 ratio (ref 0.0–5.0)
Cholesterol, Total: 140 mg/dL (ref 100–199)
HDL: 38 mg/dL — ABNORMAL LOW (ref >39)
LDL Chol Calc (NIH): 78 mg/dL (ref 0–99)
Triglycerides: 134 mg/dL (ref 0–149)
VLDL Cholesterol Cal: 24 mg/dL (ref 5–40)

## 2019-01-24 LAB — HEPATIC FUNCTION PANEL
ALT: 27 IU/L (ref 0–44)
AST: 24 IU/L (ref 0–40)
Albumin: 4.1 g/dL (ref 3.8–4.8)
Alkaline Phosphatase: 124 IU/L — ABNORMAL HIGH (ref 39–117)
Bilirubin Total: 0.7 mg/dL (ref 0.0–1.2)
Bilirubin, Direct: 0.19 mg/dL (ref 0.00–0.40)
Total Protein: 7.1 g/dL (ref 6.0–8.5)

## 2019-02-07 ENCOUNTER — Other Ambulatory Visit: Payer: Self-pay | Admitting: Cardiology

## 2019-02-07 NOTE — Telephone Encounter (Signed)
Can I fill this even though they come in on thursday

## 2019-02-07 NOTE — Telephone Encounter (Signed)
Pt aware.

## 2019-02-09 ENCOUNTER — Ambulatory Visit: Payer: Medicare Other | Admitting: Cardiology

## 2019-02-09 ENCOUNTER — Other Ambulatory Visit: Payer: Self-pay

## 2019-02-09 ENCOUNTER — Telehealth: Payer: Self-pay | Admitting: Adult Health

## 2019-02-09 ENCOUNTER — Encounter: Payer: Self-pay | Admitting: Cardiology

## 2019-02-09 VITALS — BP 173/85 | HR 83 | Temp 98.9°F | Resp 14 | Ht 71.0 in | Wt 204.2 lb

## 2019-02-09 DIAGNOSIS — E782 Mixed hyperlipidemia: Secondary | ICD-10-CM

## 2019-02-09 DIAGNOSIS — I10 Essential (primary) hypertension: Secondary | ICD-10-CM | POA: Diagnosis not present

## 2019-02-09 MED ORDER — LISINOPRIL 40 MG PO TABS: 40.0000 mg | ORAL_TABLET | Freq: Every day | ORAL | 1 refills | Status: DC

## 2019-02-09 NOTE — Progress Notes (Signed)
Blood pressure log

## 2019-02-09 NOTE — Telephone Encounter (Signed)
Per Tawanna Sat at [facility] [phone]-- No record of Tdap vaccine being given to patient by them in their system, she also check the Nat[facility]  Data base but No record was found.  --Forwarding FYI to med asst.  --glh

## 2019-02-09 NOTE — Telephone Encounter (Signed)
Noted.  T. Sarahlynn Cisnero, CMA 

## 2019-02-09 NOTE — Progress Notes (Signed)
Subjective:   William Kinnier Sr., male    DOB: 04/07/1950, 68 y.o.   MRN: [id number]   Chief complaint:  Hypertension   67 year old Caucasian male with hypertension, hyperlipidemia.  Blood pressure remains elevated today.  His home checks suggest much lower blood pressure readings. I also compared office monitor with his home monitor, and both readings are match.  He denies any chest pain, shortness of breath, vision changes, headache, nausea, vomiting. Reviewed recent lipid panel readings with the patient.   Past Medical History:  Diagnosis Date  . High cholesterol   . Hypertension      History reviewed. No pertinent surgical history.   Social History   Socioeconomic History  . Marital status: Married    Spouse name: Not on file  . Number of children: 3  . Years of education: Not on file  . Highest education level: Not on file  Occupational History  . Not on file  Tobacco Use  . Smoking status: Former Smoker    Packs/day: 1.50    Years: 40.00    Pack years: 60.00    Types: Cigarettes    Quit date: 09/05/2016    Years since quitting: 2.4  . Smokeless tobacco: Never Used  Substance and Sexual Activity  . Alcohol use: No    Comment: Quit drinking alcohol 10/20/2010  . Drug use: No  . Sexual activity: Not Currently  Other Topics Concern  . Not on file  Social History Narrative  . Not on file   Social Determinants of Health   Financial Resource Strain:   . Difficulty of Paying Living Expenses: Not on file  Food Insecurity:   . Worried About Charity fundraiser in the Last Year: Not on file  . Ran Out of Food in the Last Year: Not on file  Transportation Needs:   . Lack of Transportation (Medical): Not on file  . Lack of Transportation (Non-Medical): Not on file  Physical Activity:   . Days of Exercise per Week: Not on file  . Minutes of Exercise per Session: Not on file  Stress:   . Feeling of Stress : Not on file  Social Connections:   .  Frequency of Communication with Friends and Family: Not on file  . Frequency of Social Gatherings with Friends and Family: Not on file  . Attends Religious Services: Not on file  . Active Member of Clubs or Organizations: Not on file  . Attends Archivist Meetings: Not on file  . Marital Status: Not on file  Intimate Partner Violence:   . Fear of Current or Ex-Partner: Not on file  . Emotionally Abused: Not on file  . Physically Abused: Not on file  . Sexually Abused: Not on file     Family History  Problem Relation Age of Onset  . Stroke Father      Current Outpatient Medications on File Prior to Visit  Medication Sig Dispense Refill  . amLODipine (NORVASC) 5 MG tablet Take 1 tablet (5 mg total) by mouth daily. 30 tablet 2  . atorvastatin (LIPITOR) 80 MG tablet Take 1 tablet (80 mg total) by mouth daily. 90 tablet 1  . bisoprolol (ZEBETA) 5 MG tablet Take 1 tablet (5 mg total) by mouth daily. 30 tablet 2  . lisinopril (PRINIVIL,ZESTRIL) 40 MG tablet Take 40 mg by mouth daily.    . meclizine (ANTIVERT) 25 MG tablet Take 1 tablet (25 mg total) by mouth 3 (three) times  daily as needed for dizziness. 30 tablet 0   No current facility-administered medications on file prior to visit.    Cardiovascular studies:  EKG 06/10/2017: Sinus rhythm 83 bpm. Poor R wave progression, otherwise normal EKG  MRI Brain 03/2017: IMPRESSION: 1. No acute intracranial abnormality. 2. Age-related cerebral atrophy with mild chronic small vessel ischemic disease. 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  Echocardiogram 09/05/2016:  - Left ventricle: The cavity size was normal. Wall thickness was   increased in a pattern of mild LVH. Systolic function was normal.   The estimated ejection fraction was in the range of 50% to 55%. - Aortic valve: Trileaflet; mildly calcified leaflets. - Mitral valve: Calcified annulus. There was mild regurgitation. - Left atrium: The atrium  was at the upper limits of normal in   size. - Right atrium: Central venous pressure (est): 3 mm Hg. - Atrial septum: No defect or patent foramen ovale was identified. - Tricuspid valve: There was trivial regurgitation. - Pulmonary arteries: Systolic pressure could not be accurately   estimated. - Pericardium, extracardiac: A prominent pericardial fat pad was   present. There was no pericardial effusion.   Impressions:   - Mild LVH with LVEF 49-67%, grade 1 diastolic dysfunction. Upper   normal left atrial chamber size. Calcified mitral annulus with   mild mitral regurgitation. Trivial tricuspid regurgitation.  Lexiscan myoview stress test 11/09/2016: 1. Stress EKG is non diagnostic for ischemia due to pharmacologic    stress testing.    2. Perfusion imaging study demonstrates no evidence of ischemia with normal LV systolic function, calculated EF 64% without regional wall motion abnormality. Overall low risk study.  Event monitor 10/16/2016- 10/30/2016: Sins rhythm w/PAC's SInus tachycardia Sinus bradycardia 32 bpm with rate dependent aberrancy (3:00 AM) Junctional tachcyardia with rate dependent aberrancy    Recent labs: 01/23/2019: Chol 140, TG 134, HDL 38, LDL 78  11/24/2018: Glucose 114, BUN/Cr 15/1.21. EGFR 61. Na/K 136/4.6. Rest of the CMP normal H/H 17/50. MCV 83. Platelets 328 HbA1C 5.8% Chol 153, TG 94, HDL 43, LDL 92 TSH 2.1 normal   Review of Systems  Constitution: Negative for decreased appetite, malaise/fatigue, weight gain and weight loss.  HENT: Negative for congestion.   Eyes: Negative for visual disturbance.  Cardiovascular: Negative for chest pain, dyspnea on exertion, leg swelling, palpitations and syncope.  Respiratory: Negative for shortness of breath.   Endocrine: Negative for cold intolerance.  Hematologic/Lymphatic: Does not bruise/bleed easily.  Skin: Negative for itching and rash.  Musculoskeletal: Negative for myalgias.  Gastrointestinal:  Negative for abdominal pain, nausea and vomiting.  Genitourinary: Negative for dysuria.  Neurological: Negative for dizziness and weakness.  Psychiatric/Behavioral: The patient is not nervous/anxious.   All other systems reviewed and are negative.       Vitals:   02/09/19 1325 02/09/19 1329  BP: (!) 190/90 (!) 173/85  Pulse: 83   Resp: 14   Temp: 98.9 F (37.2 C)   SpO2: 95%     Physical Exam: Physical Exam  Constitutional: He is oriented to person, place, and time. He appears well-developed and well-nourished. No distress.  HENT:  Head: Normocephalic and atraumatic.  Eyes: Pupils are equal, round, and reactive to light. Conjunctivae are normal.  Neck: No JVD present.  Cardiovascular: Normal rate, regular rhythm and intact distal pulses.  Pulmonary/Chest: Effort normal and breath sounds normal. He has no wheezes. He has no rales.  Abdominal: Soft. Bowel sounds are normal. There is no rebound.  Musculoskeletal:  General: No edema.  Lymphadenopathy:    He has no cervical adenopathy.  Neurological: He is alert and oriented to person, place, and time. No cranial nerve deficit.  Skin: Skin is warm and dry.  Psychiatric: He has a normal mood and affect.  Nursing note and vitals reviewed.       Assessment & Recommendations:  68 year old Caucasian male with hypertension, hyperlipidemia, vertigo.  1. Essential hypertension: He certainly has a component of white coat  Hypertension. Home readings on accurate monitoring are well controlled.  Continue lisinopril 40 mg daily, amlodipine to 5 mg daily, and bisoprolol 5 mg daily.  2. Mixed hyperlipidemia LDL improved to 78 on lipitor 80 mg daily.  Continue f/u w/PCP. F/u w/me in 1 year.    Nigel Mormon, MD [facility] [place] Pager: [phone] Office: [phone] If no answer Cell [phone]

## 2019-03-14 ENCOUNTER — Ambulatory Visit (INDEPENDENT_AMBULATORY_CARE_PROVIDER_SITE_OTHER): Payer: Medicare Other | Admitting: Adult Health

## 2019-03-14 ENCOUNTER — Encounter: Payer: Self-pay | Admitting: Adult Health

## 2019-03-14 ENCOUNTER — Other Ambulatory Visit: Payer: Self-pay

## 2019-03-14 DIAGNOSIS — E7800 Pure hypercholesterolemia, unspecified: Secondary | ICD-10-CM | POA: Diagnosis not present

## 2019-03-14 DIAGNOSIS — I10 Essential (primary) hypertension: Secondary | ICD-10-CM

## 2019-03-14 DIAGNOSIS — Z0000 Encounter for general adult medical examination without abnormal findings: Secondary | ICD-10-CM | POA: Diagnosis not present

## 2019-03-14 NOTE — Patient Instructions (Signed)
Mediterranean Diet A Mediterranean diet refers to food and lifestyle choices that are based on the traditions of countries located on the [employer]. This way of eating has been shown to help prevent certain conditions and improve outcomes for people who have chronic diseases, like kidney disease and heart disease. What are tips for following this plan? Lifestyle  Cook and eat meals together with your family, when possible.  Drink enough fluid to keep your urine clear or pale yellow.  Be physically active every day. This includes: ? Aerobic exercise like running or swimming. ? Leisure activities like gardening, walking, or housework.  Get 7-8 hours of sleep each night.  Reading food labels   Check the serving size of packaged foods. For foods such as rice and pasta, the serving size refers to the amount of cooked product, not dry.  Check the total fat in packaged foods. Avoid foods that have saturated fat or trans fats.  Check the ingredients list for added sugars, such as corn syrup. Shopping  At the grocery store, buy most of your food from the areas near the walls of the store. This includes: ? Fresh fruits and vegetables (produce). ? Grains, beans, nuts, and seeds. Some of these may be available in unpackaged forms or large amounts (in bulk). ? Fresh seafood. ? Poultry and eggs. ? Low-fat dairy products.  Buy whole ingredients instead of prepackaged foods.  Buy fresh fruits and vegetables in-season from local farmers markets.  Buy frozen fruits and vegetables in resealable bags.  If you do not have access to quality fresh seafood, buy precooked frozen shrimp or canned fish, such as tuna, salmon, or sardines.  Buy small amounts of raw or cooked vegetables, salads, or olives from the deli or salad bar at your store.  Stock your pantry so you always have certain foods on hand, such as olive oil, canned tuna, canned tomatoes, rice, pasta, and beans. Cooking  Cook  foods with extra-virgin olive oil instead of using butter or other vegetable oils.  Have meat as a side dish, and have vegetables or grains as your main dish. This means having meat in small portions or adding small amounts of meat to foods like pasta or stew.  Use beans or vegetables instead of meat in common dishes like chili or lasagna.  Experiment with different cooking methods. Try roasting or broiling vegetables instead of steaming or sauteing them.  Add frozen vegetables to soups, stews, pasta, or rice.  Add nuts or seeds for added healthy fat at each meal. You can add these to yogurt, salads, or vegetable dishes.  Marinate fish or vegetables using olive oil, lemon juice, garlic, and fresh herbs. Meal planning   Plan to eat 1 vegetarian meal one day each week. Try to work up to 2 vegetarian meals, if possible.  Eat seafood 2 or more times a week.  Have healthy snacks readily available, such as: ? Vegetable sticks with hummus. ? [place] yogurt. ? Fruit and nut trail mix.  Eat balanced meals throughout the week. This includes: ? Fruit: 2-3 servings a day ? Vegetables: 4-5 servings a day ? Low-fat dairy: 2 servings a day ? Fish, poultry, or lean meat: 1 serving a day ? Beans and legumes: 2 or more servings a week ? Nuts and seeds: 1-2 servings a day ? Whole grains: 6-8 servings a day ? Extra-virgin olive oil: 3-4 servings a day  Limit red meat and sweets to only a few servings a month What  are my food choices?  Mediterranean diet ? Recommended  Grains: Whole-grain pasta. Brown rice. Bulgar wheat. Polenta. Couscous. Whole-wheat bread. [place].  Vegetables: Artichokes. Beets. Broccoli. Cabbage. Carrots. Eggplant. Green beans. Chard. Kale. Spinach. Onions. Leeks. Peas. Squash. Tomatoes. Peppers. Radishes.  Fruits: Apples. Apricots. Avocado. Berries. Bananas. Cherries. Dates. Figs. Grapes. Lemons. Melon. Oranges. Peaches. Plums. Pomegranate.  Meats and other  protein foods: Beans. Almonds. Sunflower seeds. Pine nuts. Peanuts. [place]. Salmon. Scallops. Shrimp. [place]. Tilapia. Clams. Oysters. Eggs.  Dairy: Low-fat milk. Cheese. Greek yogurt.  Beverages: Water. Red wine. Herbal tea.  Fats and oils: Extra virgin olive oil. Avocado oil. Grape seed oil.  Sweets and desserts: [place] yogurt with honey. Baked apples. Poached pears. Trail mix.  Seasoning and other foods: Basil. Cilantro. Coriander. Cumin. Mint. Parsley. Sage. Rosemary. Tarragon. Garlic. Oregano. Thyme. Pepper. Balsalmic vinegar. Tahini. Hummus. Tomato sauce. Olives. Mushrooms. ? Limit these  Grains: Prepackaged pasta or rice dishes. Prepackaged cereal with added sugar.  Vegetables: Deep fried potatoes (french fries).  Fruits: Fruit canned in syrup.  Meats and other protein foods: Beef. Pork. Lamb. Poultry with skin. Hot dogs. [place] Salines.  Dairy: Ice cream. Sour cream. Whole milk.  Beverages: Juice. Sugar-sweetened soft drinks. Beer. Liquor and spirits.  Fats and oils: Butter. Canola oil. Vegetable oil. Beef fat (tallow). Lard.  Sweets and desserts: Cookies. Cakes. Pies. Candy.  Seasoning and other foods: Mayonnaise. Premade sauces and marinades. The items listed may not be a complete list. Talk with your dietitian about what dietary choices are right for you. Summary  The Mediterranean diet includes both food and lifestyle choices.  Eat a variety of fresh fruits and vegetables, beans, nuts, seeds, and whole grains.  Limit the amount of red meat and sweets that you eat.  This information is not intended to replace advice given to you by your health care provider. Make sure you discuss any questions you have with your health care provider. Document Revised: 09/26/2015 Document Reviewed: 09/19/2015 Elsevier Patient Education  2020 [employer].   Your home blood pressure readings are at goal- continue all medications as directed. Remain well hydrated and follow heart healthy  diet. Continue to check your blood pressure (BP) /heart rate (HR) several times per week, Call clinic if BP consistently >140/90 or HR <55. Basic information for mRNA COVID-19 vaccines Please note that these CDC guidelines can change from week to week, and even day to day.  Refer to the [facility] website for the most current recommendations for any given day at [email or web address]   1) Under the EUAs (emergency use authorization), the following age groups are authorized to receive the COVID-19 vaccination: . Pfizer-BioNTech: ages ?16 years . Moderna: ages ?18 years . Children and adolescents outside of these authorized age groups should not receive COVID-19 vaccination at this time   2) You are not eligible to receive a COVID-19 vaccine if: . You have received another vaccine in the last 14-days (example: flu shot, shingles, tetanus, etc.) . You currently have a positive test for COVID-19. The vaccine must be deferred until you have recovered from the acute illness and the criteria has been met for you to discontinue isolation. . You have received passive antibody therapy (monoclonal antibodies or convalescent serum) as treatment for COVID-19 within the past 90 days   3) Currently COVID-19 vaccines are contraindicated in patients who have had an anaphylactic reaction to polyethylene glycol ( ie. GoLYTELY bowel prep or MiraLAX ) or polysorbate products (often used as a thickening agent for many  ice creams, frozen custard and whipped dessert products etc).   Also, if you had an anaphylactic reaction to your first COVID-19 vaccination, you cannot receive your second.     - Also if you are currently pregnant please check with your obstetrician to see what their recommendations are regarding vaccination.   - Also, if you have a chronic condition such as CLL, RA or other immunocompromising conditions, please know that no data are currently available (or very limited data) on the safety  and efficacy of mRNA COVID-19 vaccines in persons with autoimmune/ immunocompromising conditions.   Please seek the advice of your specialist regarding whether or not you should receive your COVID-19 vaccination.  Continue to social distance and wear a mask when in public.  Please call [phone] to schedule COVID-19 vaccine.  Follow-up with primary care in 3 months. GREAT TO SEE YOU!

## 2019-03-14 NOTE — Assessment & Plan Note (Addendum)
Ambulatory BP SBP upper 120s-130 DBP 70 HR upper 60-70s He denies acute cardiac sx's or increase in dyspnea. Continue Amlodipine 5mg  QD, Bisoprolol 5mg  QD, Lisinopril 40mg  QD

## 2019-03-14 NOTE — Assessment & Plan Note (Signed)
Your home blood pressure readings are at goal- continue all medications as directed. Remain well hydrated and follow heart healthy diet. Continue to check your blood pressure (BP) /heart rate (HR) several times per week, Call clinic if BP consistently >140/90 or HR <55. Basic information for mRNA COVID-19 vaccines Please note that these CDC guidelines can change from week to week, and even day to day.  Refer to the [facility] website for the most current recommendations for any given day at [email or web address]   1) Under the EUAs (emergency use authorization), the following age groups are authorized to receive the COVID-19 vaccination: . Pfizer-BioNTech: ages ?16 years . Moderna: ages ?18 years . Children and adolescents outside of these authorized age groups should not receive COVID-19 vaccination at this time   2) You are not eligible to receive a COVID-19 vaccine if: . You have received another vaccine in the last 14-days (example: flu shot, shingles, tetanus, etc.) . You currently have a positive test for COVID-19. The vaccine must be deferred until you have recovered from the acute illness and the criteria has been met for you to discontinue isolation. . You have received passive antibody therapy (monoclonal antibodies or convalescent serum) as treatment for COVID-19 within the past 90 days   3) Currently COVID-19 vaccines are contraindicated in patients who have had an anaphylactic reaction to polyethylene glycol ( ie. GoLYTELY bowel prep or MiraLAX ) or polysorbate products (often used as a thickening agent for many ice creams, frozen custard and whipped dessert products etc).   Also, if you had an anaphylactic reaction to your first COVID-19 vaccination, you cannot receive your second.     - Also if you are currently pregnant please check with your obstetrician to see what their recommendations are regarding vaccination.   - Also, if you have a chronic condition such as  CLL, RA or other immunocompromising conditions, please know that no data are currently available (or very limited data) on the safety and efficacy of mRNA COVID-19 vaccines in persons with autoimmune/ immunocompromising conditions.   Please seek the advice of your specialist regarding whether or not you should receive your COVID-19 vaccination.  Continue to social distance and wear a mask when in public.  Please call [phone] to schedule COVID-19 vaccine.  Follow-up with primary care in 3 months.

## 2019-03-14 NOTE — Progress Notes (Signed)
Subjective:    Patient ID: William Kinnier Sr., male    DOB: [birth year], 69 y.o.   MRN: [id number]  HPI: 03/14/2019 OV: William Hogan is here for fu: HTN, HLD. Ambulatory BP SBP upper 120s-130 DBP 70 HR upper 60-70s He denies acute cardiac sx's or increase in dyspnea. He continues to abstain from tobacco/vape use. He tries to follow heart healthy diet and remains as active as possible. He has tolerated increase in statin, denies myalgia's LDL at goal-78, hepatic fx panel- stable from 01/23/2019   [facility]- OV 02/09/2019 BP at OV 173/85 1. Essential hypertension: He certainly has a component of white coat  Hypertension. Home readings on accurate monitoring are well controlled.  Continue lisinopril 40 mg daily, amlodipine to 5 mg daily, and bisoprolol 5 mg daily.  2. Mixed hyperlipidemia LDL improved to 78 on lipitor 80 mg daily.  Continue f/u w/PCP. F/u w/me in 1 year.   Patient Care Team    Relationship Specialty Notifications Start End  Mina Marble D, NP PCP - General Family Medicine  09/29/17   Nigel Mormon, MD Consulting Physician Cardiology  09/29/17   Kathi Ludwig, MD Resident Internal Medicine  09/29/17   Tamsen Roers, MD Referring Physician Family Medicine  08/24/18     Patient Active Problem List   Diagnosis Date Noted  . Mixed hyperlipidemia 06/16/2018  . Solitary pulmonary nodule -  ? hamartoma?  12/08/2017  . Nodule of right lung 11/29/2017  . Healthcare maintenance 10/20/2017  . High cholesterol 09/29/2017  . Vertigo 09/29/2017  . Hyponatremia 09/06/2016  . Essential hypertension 09/06/2016  . Dyspnea on exertion 09/06/2016  . Near syncope 09/05/2016  . Postural dizziness with presyncope 09/05/2016     Past Medical History:  Diagnosis Date  . High cholesterol   . Hypertension      History reviewed. No pertinent surgical history.   Family History  Problem Relation Age of Onset  . Stroke Father       Social History   Substance and Sexual Activity  Drug Use No     Social History   Substance and Sexual Activity  Alcohol Use No   Comment: Quit drinking alcohol 10/20/2010     Social History   Tobacco Use  Smoking Status Former Smoker  . Packs/day: 1.50  . Years: 40.00  . Pack years: 60.00  . Types: Cigarettes  . Quit date: 09/05/2016  . Years since quitting: 2.5  Smokeless Tobacco Never Used     Outpatient Encounter Medications as of 03/14/2019  Medication Sig  . amLODipine (NORVASC) 5 MG tablet Take 1 tablet (5 mg total) by mouth daily.  Marland Kitchen atorvastatin (LIPITOR) 80 MG tablet Take 1 tablet (80 mg total) by mouth daily.  . bisoprolol (ZEBETA) 5 MG tablet Take 1 tablet (5 mg total) by mouth daily.  Marland Kitchen lisinopril (ZESTRIL) 40 MG tablet Take 1 tablet (40 mg total) by mouth daily.  . meclizine (ANTIVERT) 25 MG tablet Take 1 tablet (25 mg total) by mouth 3 (three) times daily as needed for dizziness.   No facility-administered encounter medications on file as of 03/14/2019.    Allergies: Clonidine derivatives  Body mass index is 28.65 kg/m.  Blood pressure 137/82, pulse 84, temperature 99 F (37.2 C), temperature source Oral, height '5\' 11"'$  (1.803 m), weight 205 lb 6.4 oz (93.2 kg), SpO2 97 %.   Review of Systems  Constitutional: Positive for fatigue. Negative for activity change, appetite change, chills, diaphoresis, fever and  unexpected weight change.  Eyes: Negative for visual disturbance.  Respiratory: Negative for cough, chest tightness, shortness of breath, wheezing and stridor.   Cardiovascular: Negative for chest pain, palpitations and leg swelling.  Endocrine: Negative for polydipsia, polyphagia and polyuria.  Musculoskeletal: Negative for myalgias.  Neurological: Negative for dizziness and headaches.  Hematological: Negative for adenopathy. Does not bruise/bleed easily.       Objective:   Physical Exam Vitals and nursing note reviewed.   Constitutional:      General: He is not in acute distress.    Appearance: Normal appearance. He is normal weight. He is not ill-appearing, toxic-appearing or diaphoretic.  HENT:     Head: Normocephalic and atraumatic.     Right Ear: Decreased hearing noted.     Left Ear: Decreased hearing noted.  Eyes:     Extraocular Movements: Extraocular movements intact.     Conjunctiva/sclera: Conjunctivae normal.     Pupils: Pupils are equal, round, and reactive to light.  Cardiovascular:     Rate and Rhythm: Normal rate and regular rhythm.     Pulses: Normal pulses.     Heart sounds: Normal heart sounds. No murmur. No friction rub. No gallop.   Skin:    General: Skin is warm.     Capillary Refill: Capillary refill takes less than 2 seconds.  Neurological:     Mental Status: He is alert and oriented to person, place, and time.  Psychiatric:        Mood and Affect: Mood normal.        Behavior: Behavior normal.        Thought Content: Thought content normal.        Judgment: Judgment normal.       Assessment & Plan:   1. Essential hypertension   2. Healthcare maintenance   3. High cholesterol     Essential hypertension Ambulatory BP SBP upper 120s-130 DBP 70 HR upper 60-70s He denies acute cardiac sx's or increase in dyspnea. Continue Amlodipine '5mg'$  QD, Bisoprolol '5mg'$  QD, Lisinopril '40mg'$  QD  Healthcare maintenance Your home blood pressure readings are at goal- continue all medications as directed. Remain well hydrated and follow heart healthy diet. Continue to check your blood pressure (BP) /heart rate (HR) several times per week, Call clinic if BP consistently >140/90 or HR <55. Basic information for mRNA COVID-19 vaccines Please note that these CDC guidelines can change from week to week, and even day to day.  Refer to the [facility] website for the most current recommendations for any given day at [email or web address]   1) Under the EUAs (emergency use  authorization), the following age groups are authorized to receive the COVID-19 vaccination: . Pfizer-BioNTech: ages ?16 years . Moderna: ages ?18 years . Children and adolescents outside of these authorized age groups should not receive COVID-19 vaccination at this time   2) You are not eligible to receive a COVID-19 vaccine if: . You have received another vaccine in the last 14-days (example: flu shot, shingles, tetanus, etc.) . You currently have a positive test for COVID-19. The vaccine must be deferred until you have recovered from the acute illness and the criteria has been met for you to discontinue isolation. . You have received passive antibody therapy (monoclonal antibodies or convalescent serum) as treatment for COVID-19 within the past 90 days   3) Currently COVID-19 vaccines are contraindicated in patients who have had an anaphylactic reaction to polyethylene glycol ( ie. GoLYTELY bowel prep or MiraLAX ) or polysorbate  products (often used as a thickening agent for many ice creams, frozen custard and whipped dessert products etc).   Also, if you had an anaphylactic reaction to your first COVID-19 vaccination, you cannot receive your second.     - Also if you are currently pregnant please check with your obstetrician to see what their recommendations are regarding vaccination.   - Also, if you have a chronic condition such as CLL, RA or other immunocompromising conditions, please know that no data are currently available (or very limited data) on the safety and efficacy of mRNA COVID-19 vaccines in persons with autoimmune/ immunocompromising conditions.   Please seek the advice of your specialist regarding whether or not you should receive your COVID-19 vaccination.  Continue to social distance and wear a mask when in public.  Please call [phone] to schedule COVID-19 vaccine.  Follow-up with primary care in 3 months.  High cholesterol LDL at goal - 78 - 01/23/2019   Continue on Atorvastatin '80mg'$  QD     FOLLOW-UP:  Return in about 3 months (around 06/11/2019) for Regular Follow Up, HTN, Hypercholestermia.

## 2019-03-14 NOTE — Assessment & Plan Note (Signed)
>>  ASSESSMENT AND PLAN FOR HIGH CHOLESTEROL WRITTEN ON 03/14/2019  3:12 PM BY DANFORD, KATY D, NP  LDL at goal - 78 - 01/23/2019  Continue on Atorvastatin 80mg  QD

## 2019-03-14 NOTE — Assessment & Plan Note (Signed)
LDL at goal - 78 - 01/23/2019  Continue on Atorvastatin 80mg  QD

## 2019-03-23 ENCOUNTER — Emergency Department (HOSPITAL_COMMUNITY): Payer: Medicare Other

## 2019-03-23 ENCOUNTER — Emergency Department (HOSPITAL_COMMUNITY)
Admission: EM | Admit: 2019-03-23 | Discharge: 2019-03-24 | Disposition: A | Discharge status: Home / Self Care | Payer: Medicare Other | Attending: Emergency Medicine | Admitting: Emergency Medicine

## 2019-03-23 ENCOUNTER — Encounter (HOSPITAL_COMMUNITY): Payer: Self-pay | Admitting: Emergency Medicine

## 2019-03-23 ENCOUNTER — Other Ambulatory Visit: Payer: Self-pay

## 2019-03-23 DIAGNOSIS — R05 Cough: Secondary | ICD-10-CM | POA: Insufficient documentation

## 2019-03-23 DIAGNOSIS — R0902 Hypoxemia: Secondary | ICD-10-CM | POA: Diagnosis not present

## 2019-03-23 DIAGNOSIS — R112 Nausea with vomiting, unspecified: Secondary | ICD-10-CM | POA: Insufficient documentation

## 2019-03-23 DIAGNOSIS — R531 Weakness: Secondary | ICD-10-CM | POA: Diagnosis not present

## 2019-03-23 DIAGNOSIS — Z87891 Personal history of nicotine dependence: Secondary | ICD-10-CM | POA: Diagnosis not present

## 2019-03-23 DIAGNOSIS — R42 Dizziness and giddiness: Secondary | ICD-10-CM | POA: Diagnosis not present

## 2019-03-23 DIAGNOSIS — I10 Essential (primary) hypertension: Secondary | ICD-10-CM | POA: Diagnosis not present

## 2019-03-23 DIAGNOSIS — I6523 Occlusion and stenosis of bilateral carotid arteries: Secondary | ICD-10-CM | POA: Diagnosis not present

## 2019-03-23 DIAGNOSIS — Z743 Need for continuous supervision: Secondary | ICD-10-CM | POA: Diagnosis not present

## 2019-03-23 LAB — COMPREHENSIVE METABOLIC PANEL
ALT: 29 U/L (ref 0–44)
AST: 31 U/L (ref 15–41)
Albumin: 4 g/dL (ref 3.5–5.0)
Alkaline Phosphatase: 87 U/L (ref 38–126)
Anion gap: 11 (ref 5–15)
BUN: 17 mg/dL (ref 8–23)
CO2: 24 mmol/L (ref 22–32)
Calcium: 9 mg/dL (ref 8.9–10.3)
Chloride: 102 mmol/L (ref 98–111)
Creatinine, Ser: 1.18 mg/dL (ref 0.61–1.24)
GFR calc Af Amer: >60 mL/min (ref >60)
GFR calc non Af Amer: >60 mL/min (ref >60)
Glucose, Bld: 168 mg/dL — ABNORMAL HIGH (ref 70–99)
Potassium: 4.4 mmol/L (ref 3.5–5.1)
Sodium: 137 mmol/L (ref 135–145)
Total Bilirubin: 0.9 mg/dL (ref 0.3–1.2)
Total Protein: 7 g/dL (ref 6.5–8.1)

## 2019-03-23 LAB — URINALYSIS, ROUTINE W REFLEX MICROSCOPIC
Bilirubin Urine: NEGATIVE
Glucose, UA: NEGATIVE mg/dL
Hgb urine dipstick: NEGATIVE
Ketones, ur: NEGATIVE mg/dL
Leukocytes,Ua: NEGATIVE
Nitrite: NEGATIVE
Protein, ur: NEGATIVE mg/dL
Specific Gravity, Urine: 1.011 (ref 1.005–1.030)
pH: 6 (ref 5.0–8.0)

## 2019-03-23 LAB — CBC
HCT: 49.7 % (ref 39.0–52.0)
Hemoglobin: 16 g/dL (ref 13.0–17.0)
MCH: 28.6 pg (ref 26.0–34.0)
MCHC: 32.2 g/dL (ref 30.0–36.0)
MCV: 88.8 fL (ref 80.0–100.0)
Platelets: 376 10*3/uL (ref 150–400)
RBC: 5.6 MIL/uL (ref 4.22–5.81)
RDW: 13.2 % (ref 11.5–15.5)
WBC: 16.5 10*3/uL — ABNORMAL HIGH (ref 4.0–10.5)
nRBC: 0 % (ref 0.0–0.2)

## 2019-03-23 LAB — TROPONIN I (HIGH SENSITIVITY)
Troponin I (High Sensitivity): 3 ng/L (ref <18)
Troponin I (High Sensitivity): 3 ng/L (ref <18)

## 2019-03-23 LAB — LIPASE, BLOOD: Lipase: 19 U/L (ref 11–51)

## 2019-03-23 MED ORDER — SODIUM CHLORIDE 0.9 % IV BOLUS
500.0000 mL | Freq: Once | INTRAVENOUS | Status: AC
  Administered 2019-03-23: 500 mL via INTRAVENOUS

## 2019-03-23 MED ORDER — MECLIZINE HCL 25 MG PO TABS
25.0000 mg | ORAL_TABLET | Freq: Once | ORAL | Status: AC
  Administered 2019-03-23: 22:00:00 25 mg via ORAL
  Filled 2019-03-23: qty 1

## 2019-03-23 MED ORDER — LORAZEPAM 2 MG/ML IJ SOLN
1.0000 mg | Freq: Once | INTRAMUSCULAR | Status: AC
  Administered 2019-03-23: 1 mg via INTRAVENOUS
  Filled 2019-03-23: qty 1

## 2019-03-23 MED ORDER — DIAZEPAM 2 MG PO TABS
2.0000 mg | ORAL_TABLET | Freq: Once | ORAL | Status: AC
  Administered 2019-03-23: 2 mg via ORAL
  Filled 2019-03-23: qty 1

## 2019-03-23 MED ORDER — IOHEXOL 350 MG/ML SOLN
75.0000 mL | Freq: Once | INTRAVENOUS | Status: AC | PRN
  Administered 2019-03-23: 75 mL via INTRAVENOUS

## 2019-03-23 NOTE — ED Notes (Signed)
Pt transported to CT ?

## 2019-03-23 NOTE — ED Provider Notes (Signed)
[facility] EMERGENCY DEPARTMENT Provider Note   CSN: [id number] Arrival date & time: 03/23/19  1333    History Chief Complaint  Patient presents with   Nausea   Emesis    William Hogan Sr. is a 69 y.o. male with has medical history significant for hypertension, hyperlipidemia, vertigo presents for ration dizziness and weakness.  Patient states he was sitting at his dining room table just gotten there at breakfast.  Patient states he felt a sudden onset dizziness.  He denies any headache however states he did have some pain in the posterior aspect of his neck when this occurred.  Patient states he feels too dizzy to stand.  Has history of vertigo and states this felt similar however "much worse."  Patient states also had sudden onset nausea and vomiting.  He denies any facial droop, difficulty speaking, unilateral weakness, paresthesias, chest pain, shortness of breath abdominal pain, diarrhea dysuria.  States he took 1 meclizine, 25 mg tablet which did not help with his symptoms.  Symptoms started at 10 AM this morning, 6 hours PTA.  Denies fever, chills, blurred vision, visual field cut.  Patient was given 4 mg Zofran p.o. by EMS with significant relief of his nausea and vomiting.  Patient is unsure if his symptoms are worse when he moves however states he is too dizzy to stand.  Denies additional aggravating or alleviating factors.  History obtained from patient and past medical records.  No interpreter is used.  HPI     Past Medical History:  Diagnosis Date   High cholesterol    Hypertension     Patient Active Problem List   Diagnosis Date Noted   Mixed hyperlipidemia 06/16/2018   Solitary pulmonary nodule -  ? hamartoma?  12/08/2017   Nodule of right lung 11/29/2017   Healthcare maintenance 10/20/2017   High cholesterol 09/29/2017   Vertigo 09/29/2017   Hyponatremia 09/06/2016   Essential hypertension 09/06/2016   Dyspnea on exertion  09/06/2016   Near syncope 09/05/2016   Postural dizziness with presyncope 09/05/2016    History reviewed. No pertinent surgical history.     Family History  Problem Relation Age of Onset   Stroke Father     Social History   Tobacco Use   Smoking status: Former Smoker    Packs/day: 1.50    Years: 40.00    Pack years: 60.00    Types: Cigarettes    Quit date: 09/05/2016    Years since quitting: 2.5   Smokeless tobacco: Never Used  Substance Use Topics   Alcohol use: No    Comment: Quit drinking alcohol 10/20/2010   Drug use: No    Home Medications Prior to Admission medications   Medication Sig Start Date End Date Taking? Authorizing Provider  amLODipine (NORVASC) 5 MG tablet Take 1 tablet (5 mg total) by mouth daily. 12/15/18   Patwardhan, Anabel Bene, MD  atorvastatin (LIPITOR) 80 MG tablet Take 1 tablet (80 mg total) by mouth daily. 12/12/18   Danford, Orpha Bur D, NP  bisoprolol (ZEBETA) 5 MG tablet Take 1 tablet (5 mg total) by mouth daily. 12/15/18   Patwardhan, Anabel Bene, MD  lisinopril (ZESTRIL) 40 MG tablet Take 1 tablet (40 mg total) by mouth daily. 02/09/19   Patwardhan, Anabel Bene, MD  meclizine (ANTIVERT) 25 MG tablet Take 1 tablet (25 mg total) by mouth 3 (three) times daily as needed for dizziness. 03/12/17   Rolan Bucco, MD    Allergies  Clonidine derivatives  Review of Systems   Review of Systems  Constitutional: Negative.   HENT: Negative.   Respiratory: Positive for cough. Negative for apnea, choking, chest tightness, shortness of breath, wheezing and stridor.   Cardiovascular: Negative.   Gastrointestinal: Positive for nausea and vomiting. Negative for abdominal distention, abdominal pain, anal bleeding, blood in stool, constipation, diarrhea and rectal pain.  Genitourinary: Negative.   Musculoskeletal: Positive for neck pain. Negative for arthralgias, back pain, gait problem, joint swelling, myalgias and neck stiffness.  Skin: Negative.     Neurological: Positive for dizziness. Negative for tremors, seizures, syncope, facial asymmetry, speech difficulty, weakness, light-headedness, numbness and headaches.  All other systems reviewed and are negative.   Physical Exam Updated Vital Signs BP (!) 157/86    Pulse 85    Temp 98 F (36.7 C) (Oral)    Resp 17    SpO2 93%   Physical Exam Physical Exam  Constitutional: Pt is oriented to person, place, and time. Pt appears well-developed and well-nourished. No distress.  HENT:  Head: Normocephalic and atraumatic.  Mouth/Throat: Oropharynx is clear and moist.  Eyes: Conjunctivae and EOM are normal. Pupils are equal, round, and reactive to light. No scleral icterus.  2 beats Nystagmus to left Neck: Normal range of motion. Neck supple.  Full active and passive ROM without pain No midline or paraspinal tenderness No nuchal rigidity or meningeal signs  Cardiovascular: Normal rate, regular rhythm and intact distal pulses.   Pulmonary/Chest: Effort normal and breath sounds normal. No respiratory distress. Pt has no wheezes. No rales.  Abdominal: Soft. Bowel sounds are normal. There is no tenderness. There is no rebound and no guarding.  Musculoskeletal: Normal range of motion.  Lymphadenopathy:    No cervical adenopathy.  Neurological: Pt. is alert and oriented to person, place, and time. He has normal reflexes. No cranial nerve deficit.  Exhibits normal muscle tone. Coordination normal.  Mental Status:  Alert, oriented, thought content appropriate. Speech fluent without evidence of aphasia. Able to follow 2 step commands without difficulty.  Cranial Nerves:  II:  Peripheral visual fields grossly normal, pupils equal, round, reactive to light III,IV, VI: ptosis not present, extra-ocular motions intact bilaterally  V,VII: smile symmetric, facial light touch sensation equal VIII: hearing grossly normal bilaterally  IX,X: midline uvula rise  XI: bilateral shoulder shrug equal and  strong XII: midline tongue extension  Motor:  5/5 in upper and lower extremities bilaterally including strong and equal grip strength and dorsiflexion/plantar flexion Sensory: Pinprick and light touch normal in all extremities.  Deep Tendon Reflexes: 2+ and symmetric  Cerebellar: Abnormal finger to nose bilateral upper extremities Gait: Unable to perform gait 2/2 dizziness CV: distal pulses palpable throughout   Skin: Skin is warm and dry. No rash noted. Pt is not diaphoretic.  Psychiatric: Pt has a normal mood and affect. Behavior is normal. Judgment and thought content normal.  Nursing note and vitals reviewed. ED Results / Procedures / Treatments   Labs (all labs ordered are listed, but only abnormal results are displayed) Labs Reviewed  COMPREHENSIVE METABOLIC PANEL - Abnormal; Notable for the following components:      Result Value   Glucose, Bld 168 (*)    All other components within normal limits  CBC - Abnormal; Notable for the following components:   WBC 16.5 (*)    All other components within normal limits  LIPASE, BLOOD  URINALYSIS, ROUTINE W REFLEX MICROSCOPIC  TROPONIN I (HIGH SENSITIVITY)  TROPONIN I (HIGH SENSITIVITY)  EKG EKG Interpretation  Date/Time:  Thursday March 23 2019 13:50:00 EST Ventricular Rate:  85 PR Interval:    QRS Duration: 86 QT Interval:  378 QTC Calculation: 449 R Axis:   34 Text Interpretation: Normal sinus rhythm Nonspecific T wave abnormality Abnormal ekg since last tracing no significant change Confirmed by Eber Hong ([id number]) on 03/23/2019 3:25:41 PM   Radiology CT Angio Head W or Wo Contrast  Result Date: 03/23/2019 CLINICAL DATA:  Vertigo.  Neck pain.  Nausea and vomiting. EXAM: CT ANGIOGRAPHY HEAD AND NECK TECHNIQUE: Multidetector CT imaging of the head and neck was performed using the standard protocol during bolus administration of intravenous contrast. Multiplanar CT image reconstructions and MIPs were obtained to  evaluate the vascular anatomy. Carotid stenosis measurements (when applicable) are obtained utilizing NASCET criteria, using the distal internal carotid diameter as the denominator. CONTRAST:  82mL OMNIPAQUE IOHEXOL 350 MG/ML SOLN COMPARISON:  MRI 03/12/2017 FINDINGS: CT HEAD FINDINGS Brain: No sign of acute or subacute infarction. Chronic small-vessel ischemic changes of the cerebral hemispheric white matter. No mass lesion, hemorrhage, hydrocephalus or extra-axial collection. Vascular: There is atherosclerotic calcification of the major vessels at the base of the brain. Skull: Negative Sinuses: Clear Orbits: Normal Review of the MIP images confirms the above findings CTA NECK FINDINGS Aortic arch: Aortic atherosclerosis. No aneurysm or dissection. Branching pattern is normal without origin stenosis. Right carotid system: Common carotid artery shows some atherosclerotic plaque but is widely patent to the bifurcation region. Calcified plaque at the carotid bifurcation and ICA bulb. Minimal diameter in the distal bulb is 1 mm. Compared to a more distal cervical ICA diameter of 4.3 mm, this indicates an 80% stenosis. Left carotid system: Common carotid artery shows some atherosclerotic plaque but is widely patent to the bifurcation region. Calcified plaque at the carotid bifurcation and ICA bulb. Minimal diameter at the distal bulb is 1 mm. Compared to a more distal cervical ICA diameter of 4.3 mm, this indicates an 80% stenosis. Vertebral arteries: There is atherosclerotic plaque at both vertebral artery origins. Stenosis estimated at 30-50% on each side. Beyond the origins, there are small areas of plaque along the course through the cervical region but no stenosis. Skeleton: Mild cervical spondylosis and facet arthritis. Other neck: No mass or lymphadenopathy. Upper chest: Small mediastinal lymph nodes, not likely pathologic. Emphysematous change in the upper lobes. 15 mm nodule in the right upper lobe this is  unchanged compared to a previous chest CT in October of 2019 and therefore certainly benign. Review of the MIP images confirms the above findings CTA HEAD FINDINGS Anterior circulation: Both internal carotid arteries are patent through the skull base and siphon regions. There is atherosclerotic calcification in both carotid siphon regions with stenosis estimated at 30-50%. No high-grade siphon stenosis. The anterior and middle cerebral vessels are patent. No large or medium vessel occlusion is seen. No correctable proximal stenosis. No aneurysm. Posterior circulation: Both vertebral arteries are patent through the foramen magnum to the basilar. No basilar stenosis. Posterior circulation branch vessels are normal. Dominant left PICA. Venous sinuses: Patent and normal. Anatomic variants: None Review of the MIP images confirms the above findings IMPRESSION: Atherosclerotic disease at both carotid bifurcations. Extensive calcified plaque at the bifurcations and ICA bulb on each side. Maximal stenosis of 80% in each ICA bulb region. 30-50% stenosis at both vertebral artery origins. 30-50% stenosis in both carotid siphon regions. No intracranial large or medium vessel occlusion. 15 mm right upper lobe pulmonary nodule, unchanged since  October of 2019 and certainly benign. Aortic Atherosclerosis (ICD10-I70.0) and Emphysema (ICD10-J43.9). Electronically Signed   By: Paulina Fusi M.D.   On: 03/23/2019 19:27   CT Angio Neck W and/or Wo Contrast  Result Date: 03/23/2019 CLINICAL DATA:  Vertigo.  Neck pain.  Nausea and vomiting. EXAM: CT ANGIOGRAPHY HEAD AND NECK TECHNIQUE: Multidetector CT imaging of the head and neck was performed using the standard protocol during bolus administration of intravenous contrast. Multiplanar CT image reconstructions and MIPs were obtained to evaluate the vascular anatomy. Carotid stenosis measurements (when applicable) are obtained utilizing NASCET criteria, using the distal internal carotid  diameter as the denominator. CONTRAST:  24mL OMNIPAQUE IOHEXOL 350 MG/ML SOLN COMPARISON:  MRI 03/12/2017 FINDINGS: CT HEAD FINDINGS Brain: No sign of acute or subacute infarction. Chronic small-vessel ischemic changes of the cerebral hemispheric white matter. No mass lesion, hemorrhage, hydrocephalus or extra-axial collection. Vascular: There is atherosclerotic calcification of the major vessels at the base of the brain. Skull: Negative Sinuses: Clear Orbits: Normal Review of the MIP images confirms the above findings CTA NECK FINDINGS Aortic arch: Aortic atherosclerosis. No aneurysm or dissection. Branching pattern is normal without origin stenosis. Right carotid system: Common carotid artery shows some atherosclerotic plaque but is widely patent to the bifurcation region. Calcified plaque at the carotid bifurcation and ICA bulb. Minimal diameter in the distal bulb is 1 mm. Compared to a more distal cervical ICA diameter of 4.3 mm, this indicates an 80% stenosis. Left carotid system: Common carotid artery shows some atherosclerotic plaque but is widely patent to the bifurcation region. Calcified plaque at the carotid bifurcation and ICA bulb. Minimal diameter at the distal bulb is 1 mm. Compared to a more distal cervical ICA diameter of 4.3 mm, this indicates an 80% stenosis. Vertebral arteries: There is atherosclerotic plaque at both vertebral artery origins. Stenosis estimated at 30-50% on each side. Beyond the origins, there are small areas of plaque along the course through the cervical region but no stenosis. Skeleton: Mild cervical spondylosis and facet arthritis. Other neck: No mass or lymphadenopathy. Upper chest: Small mediastinal lymph nodes, not likely pathologic. Emphysematous change in the upper lobes. 15 mm nodule in the right upper lobe this is unchanged compared to a previous chest CT in October of 2019 and therefore certainly benign. Review of the MIP images confirms the above findings CTA HEAD  FINDINGS Anterior circulation: Both internal carotid arteries are patent through the skull base and siphon regions. There is atherosclerotic calcification in both carotid siphon regions with stenosis estimated at 30-50%. No high-grade siphon stenosis. The anterior and middle cerebral vessels are patent. No large or medium vessel occlusion is seen. No correctable proximal stenosis. No aneurysm. Posterior circulation: Both vertebral arteries are patent through the foramen magnum to the basilar. No basilar stenosis. Posterior circulation branch vessels are normal. Dominant left PICA. Venous sinuses: Patent and normal. Anatomic variants: None Review of the MIP images confirms the above findings IMPRESSION: Atherosclerotic disease at both carotid bifurcations. Extensive calcified plaque at the bifurcations and ICA bulb on each side. Maximal stenosis of 80% in each ICA bulb region. 30-50% stenosis at both vertebral artery origins. 30-50% stenosis in both carotid siphon regions. No intracranial large or medium vessel occlusion. 15 mm right upper lobe pulmonary nodule, unchanged since October of 2019 and certainly benign. Aortic Atherosclerosis (ICD10-I70.0) and Emphysema (ICD10-J43.9). Electronically Signed   By: Paulina Fusi M.D.   On: 03/23/2019 19:27   DG Chest Portable 1 View  Result Date: 03/23/2019  CLINICAL DATA:  Cough, dizziness, vertigo, vomiting EXAM: PORTABLE CHEST 1 VIEW COMPARISON:  03/12/2017 FINDINGS: Single frontal view of the chest demonstrates an unremarkable cardiac silhouette. Chronic interstitial scarring without airspace disease, effusion, or pneumothorax. 12 x 9 mm right upper lobe nodule projecting over the right anterior third rib is unchanged since 2018. No acute bony abnormalities. IMPRESSION: 1. Stable right upper lobe pulmonary nodule. 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 2. No acute intrathoracic process. Electronically Signed   By: Sharlet SalinaMichael  Brown M.D.   On:  03/23/2019 16:53    Procedures Procedures (including critical care time)  Medications Ordered in ED Medications  diazepam (VALIUM) tablet 2 mg (2 mg Oral Given 03/23/19 1656)  sodium chloride 0.9 % bolus 500 mL (0 mLs Intravenous Stopped 03/23/19 1819)  iohexol (OMNIPAQUE) 350 MG/ML injection 75 mL (75 mLs Intravenous Contrast Given 03/23/19 1905)  LORazepam (ATIVAN) injection 1 mg (1 mg Intravenous Given 03/23/19 2250)  meclizine (ANTIVERT) tablet 25 mg (25 mg Oral Given 03/23/19 2211)    ED Course  I have reviewed the triage vital signs and the nursing notes.  Pertinent labs & imaging results that were available during my care of the patient were reviewed by me and considered in my medical decision making (see chart for details).  69 year old male appears otherwise well presents for evaluation of sudden onset dizziness, nausea and emesis.  Patient denies prior sudden onset thunderclap headache however states he did get neck pain.  Heart and lungs clear.  Abdomen soft, nontender.  He does have chronic cough.  Denies any Covid exposures.  No chest pain, shortness of breath or hemoptysis.  No evidence of DVT on exam.  Patient's neurologic exam shows abnormal finger-to-nose bilaterally.  Unable to assess gait due to dizziness.  He does have nystagmus to the left.  Due to neck pain will obtain CTA head and neck.  Labs obtained from triage.  Given no relief with meclizine at home.  Will give Valium for his dizziness.  Patient reassess. Dizziness with improvement at rest however with ataxic gait with minimal ambulation. CTA head and neck reassuring. Will order MR brain to assess for central cause of dizziness given ataxic gait.  Delta trop negative CBC with leukocytosis CMP with mild hyperglycemia at 168 UA negative CTA head, neck negative for dissection, aneurysm.  Patient reassessed. States dizziness with significant improvement with Meclizine. Orthostatic VS negative and able to ambulate with  nursing without difficulty. Pending MR brain to r/o central cause of dizziness.   Care transferred to Muthersbaugh PA-C who will follow up on MR imaging. If negative for CVA and patient able to continue to ambulate without ataxia may dc home with Diazepam and strict return precautions.   Patient seen and evaluated by attending Dr. Saul FordyceZacowski who agrees with above treatment, plan.     Clinical Course as of Mar 23 2335  Thu Mar 23, 2019  2335 Plan: MRI for dizziness.  If neg, he can be d/c home with valium for home   [HM]    Clinical Course User Index [HM] Muthersbaugh, Boyd KerbsHannah, PA-C   MDM Rules/Calculators/A&P                       Final Clinical Impression(s) / ED Diagnoses Final diagnoses:  Dizziness    Rx / DC Orders ED Discharge Orders    None       Kem Hensen A, PA-C 03/23/19 2337    Vanetta MuldersZackowski, Scott,  MD 04/01/19 1115

## 2019-03-23 NOTE — ED Notes (Signed)
Pt able to stand and ambulate around room with no complaint of dizziness. Only endorses feeling "a little wobbly"

## 2019-03-23 NOTE — ED Provider Notes (Signed)
Care assumed from [facility], PA-C.  Please see her full H&P.  In short,  William SAUNDERS Sr. is a 69 y.o. male with a history of vertigo presents for dizziness and weakness along with some neck pain.  Initial work-up was reassuring with normal CTA and normal lab work.  No relief with meclizine.  Patient given Valium and MRI was ordered.  Patient evaluated by Dr. Deretha Emory who was in agreement with the plan for MRI and d/c home if negative.  Physical Exam  BP (!) 157/86   Pulse 85   Temp 98 F (36.7 C) (Oral)   Resp 17   SpO2 93%   Physical Exam Vitals and nursing note reviewed.  Constitutional:      General: He is not in acute distress.    Appearance: He is well-developed.  HENT:     Head: Normocephalic.  Eyes:     General: No scleral icterus.    Conjunctiva/sclera: Conjunctivae normal.  Cardiovascular:     Rate and Rhythm: Normal rate.  Pulmonary:     Effort: Pulmonary effort is normal.  Musculoskeletal:        General: Normal range of motion.     Cervical back: Normal range of motion.  Skin:    General: Skin is warm and dry.  Neurological:     Mental Status: He is alert.     Comments: Pt alert and oriented; conversational and speech is goal oriented.  No slurred speech.   Ambulates with steady gait.     ED Course/Procedures   Clinical Course as of Mar 22 2333  Thu Mar 23, 2019  2335 Plan: MRI for dizziness.  If neg, he can be d/c home with valium for home   [HM]    Clinical Course User Index [HM] William Hogan, Boyd Kerbs     MR BRAIN WO CONTRAST  Result Date: 03/23/2019 CLINICAL DATA:  Hypertension. Acute presentation with vertigo, dizziness and weakness. EXAM: MRI HEAD WITHOUT CONTRAST TECHNIQUE: Multiplanar, multiecho pulse sequences of the brain and surrounding structures were obtained without intravenous contrast. COMPARISON:  CT studies same day. FINDINGS: Brain: Diffusion imaging does not show any acute or subacute infarction. Old small vessel  infarction in the right pons. No focal cerebellar finding. Cerebral hemispheres show moderate chronic small-vessel ischemic changes of the hemispheric white matter. No cortical or large vessel territory infarction. No mass lesion, hemorrhage, hydrocephalus or extra-axial collection. Vascular: Major vessels at the base of the brain show flow. Skull and upper cervical spine: Negative Sinuses/Orbits: Clear/normal Other: None IMPRESSION: No acute finding by MRI. 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 Moderate chronic small-vessel ischemic changes of the cerebral hemispheric white matter. Electronically Signed   By: Paulina Fusi M.D.   On: 03/23/2019 23:44     Procedures  MDM   Patient presents with complaints of dizziness.  Initial provider found patient ataxic.  This seems to have improved after Valium however patient is pending MRI.  1:33 AM Pt able to ambulate unassisted without ataxic gait.  No ataxia.  He reports he feels much better and is ready for discharge home.  MRI without acute TBI.  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  Patient will need close follow-up with primary care.  Discussed reasons to return immediately to the emergency department.  Patient states understanding and is in agreement with this plan.  Dizziness      William Hogan, Boyd Kerbs 03/24/19 0143    Zadie Rhine, MD 03/24/19 [phone]

## 2019-03-23 NOTE — Discharge Instructions (Signed)
Take the medication Valium as needed for dizziness. Do not drive or operate heavy machinery while taking this medication.  Follow up with your Primary care provider if symptoms do not improve.  If new or worsening symptoms such as difficulty with work finding, unilateral weakness, numbness, increased dizziness where you are unable to stand or walk seek reevaluation in the ED.

## 2019-03-23 NOTE — ED Notes (Signed)
PT transported to MRI

## 2019-03-23 NOTE — ED Triage Notes (Signed)
Per [facility] pt c/o dizziness, weakness, nausea and vomiting onset of 10am. 4mg  zofran given PO PTA.

## 2019-03-23 NOTE — ED Provider Notes (Signed)
Medical screening examination/treatment/procedure(s) were conducted as a shared visit with non-physician practitioner(s) and myself.  I personally evaluated the patient during the encounter.  EKG Interpretation  Date/Time:  Thursday March 23 2019 13:50:00 EST Ventricular Rate:  85 PR Interval:    QRS Duration: 86 QT Interval:  378 QTC Calculation: 449 R Axis:   34 Text Interpretation: Normal sinus rhythm Nonspecific T wave abnormality Abnormal ekg since last tracing no significant change Confirmed by Eber Hong ([id number]) on 03/23/2019 3:25:41 PM   Patient seen by me along with the physician assistant.  Patient brought in by [facility].  Patient with onset of dizziness nausea and vomiting symptoms somewhat consistent with vertigo but patient's had vertigo before he says this seems to be more intense.  Onset was 10 this morning.  Difficulty controlling his dizziness here.  Patient was given Antivert and then also given some Valium.  Work-up to include CT head CTA and now progressing to MRI which I think is important to rule out stroke as the cause of his ataxia.  Could when you get him up he has significant ataxia.  If MRI shows evidence of stroke patient obviously will require admission.  If does not show evidence of stroke at this point patient struggling a lot to get around.  May require medical admission just for the ataxia.  But a conversation can be had with the patient at that time.  CTA did show a fair amount of vertebral narrowing.  Which may explain some of the symptoms.  MRI pending.  Results for orders placed or performed during the hospital encounter of 03/23/19  Lipase, blood  Result Value Ref Range   Lipase 19 11 - 51 U/L  Comprehensive metabolic panel  Result Value Ref Range   Sodium 137 135 - 145 mmol/L   Potassium 4.4 3.5 - 5.1 mmol/L   Chloride 102 98 - 111 mmol/L   CO2 24 22 - 32 mmol/L   Glucose, Bld 168 (H) 70 - 99 mg/dL   BUN 17 8 - 23 mg/dL    Creatinine, Ser 8.52 0.61 - 1.24 mg/dL   Calcium 9.0 8.9 - 77.8 mg/dL   Total Protein 7.0 6.5 - 8.1 g/dL   Albumin 4.0 3.5 - 5.0 g/dL   AST 31 15 - 41 U/L   ALT 29 0 - 44 U/L   Alkaline Phosphatase 87 38 - 126 U/L   Total Bilirubin 0.9 0.3 - 1.2 mg/dL   GFR calc non Af Amer >60 >60 mL/min   GFR calc Af Amer >60 >60 mL/min   Anion gap 11 5 - 15  CBC  Result Value Ref Range   WBC 16.5 (H) 4.0 - 10.5 K/uL   RBC 5.60 4.22 - 5.81 MIL/uL   Hemoglobin 16.0 13.0 - 17.0 g/dL   HCT 24.2 35.3 - 61.4 %   MCV 88.8 80.0 - 100.0 fL   MCH 28.6 26.0 - 34.0 pg   MCHC 32.2 30.0 - 36.0 g/dL   RDW 43.1 54.0 - 08.6 %   Platelets 376 150 - 400 K/uL   nRBC 0.0 0.0 - 0.2 %  Urinalysis, Routine w reflex microscopic  Result Value Ref Range   Color, Urine YELLOW YELLOW   APPearance CLEAR CLEAR   Specific Gravity, Urine 1.011 1.005 - 1.030   pH 6.0 5.0 - 8.0   Glucose, UA NEGATIVE NEGATIVE mg/dL   Hgb urine dipstick NEGATIVE NEGATIVE   Bilirubin Urine NEGATIVE NEGATIVE   Ketones, ur NEGATIVE  NEGATIVE mg/dL   Protein, ur NEGATIVE NEGATIVE mg/dL   Nitrite NEGATIVE NEGATIVE   Leukocytes,Ua NEGATIVE NEGATIVE  Troponin I (High Sensitivity)  Result Value Ref Range   Troponin I (High Sensitivity) 3 <18 ng/L  Troponin I (High Sensitivity)  Result Value Ref Range   Troponin I (High Sensitivity) 3 <18 ng/L   CT Angio Head W or Wo Contrast  Result Date: 03/23/2019 CLINICAL DATA:  Vertigo.  Neck pain.  Nausea and vomiting. EXAM: CT ANGIOGRAPHY HEAD AND NECK TECHNIQUE: Multidetector CT imaging of the head and neck was performed using the standard protocol during bolus administration of intravenous contrast. Multiplanar CT image reconstructions and MIPs were obtained to evaluate the vascular anatomy. Carotid stenosis measurements (when applicable) are obtained utilizing NASCET criteria, using the distal internal carotid diameter as the denominator. CONTRAST:  71mL OMNIPAQUE IOHEXOL 350 MG/ML SOLN COMPARISON:  MRI  03/12/2017 FINDINGS: CT HEAD FINDINGS Brain: No sign of acute or subacute infarction. Chronic small-vessel ischemic changes of the cerebral hemispheric white matter. No mass lesion, hemorrhage, hydrocephalus or extra-axial collection. Vascular: There is atherosclerotic calcification of the major vessels at the base of the brain. Skull: Negative Sinuses: Clear Orbits: Normal Review of the MIP images confirms the above findings CTA NECK FINDINGS Aortic arch: Aortic atherosclerosis. No aneurysm or dissection. Branching pattern is normal without origin stenosis. Right carotid system: Common carotid artery shows some atherosclerotic plaque but is widely patent to the bifurcation region. Calcified plaque at the carotid bifurcation and ICA bulb. Minimal diameter in the distal bulb is 1 mm. Compared to a more distal cervical ICA diameter of 4.3 mm, this indicates an 80% stenosis. Left carotid system: Common carotid artery shows some atherosclerotic plaque but is widely patent to the bifurcation region. Calcified plaque at the carotid bifurcation and ICA bulb. Minimal diameter at the distal bulb is 1 mm. Compared to a more distal cervical ICA diameter of 4.3 mm, this indicates an 80% stenosis. Vertebral arteries: There is atherosclerotic plaque at both vertebral artery origins. Stenosis estimated at 30-50% on each side. Beyond the origins, there are small areas of plaque along the course through the cervical region but no stenosis. Skeleton: Mild cervical spondylosis and facet arthritis. Other neck: No mass or lymphadenopathy. Upper chest: Small mediastinal lymph nodes, not likely pathologic. Emphysematous change in the upper lobes. 15 mm nodule in the right upper lobe this is unchanged compared to a previous chest CT in October of 2019 and therefore certainly benign. Review of the MIP images confirms the above findings CTA HEAD FINDINGS Anterior circulation: Both internal carotid arteries are patent through the skull base  and siphon regions. There is atherosclerotic calcification in both carotid siphon regions with stenosis estimated at 30-50%. No high-grade siphon stenosis. The anterior and middle cerebral vessels are patent. No large or medium vessel occlusion is seen. No correctable proximal stenosis. No aneurysm. Posterior circulation: Both vertebral arteries are patent through the foramen magnum to the basilar. No basilar stenosis. Posterior circulation branch vessels are normal. Dominant left PICA. Venous sinuses: Patent and normal. Anatomic variants: None Review of the MIP images confirms the above findings IMPRESSION: Atherosclerotic disease at both carotid bifurcations. Extensive calcified plaque at the bifurcations and ICA bulb on each side. Maximal stenosis of 80% in each ICA bulb region. 30-50% stenosis at both vertebral artery origins. 30-50% stenosis in both carotid siphon regions. No intracranial large or medium vessel occlusion. 15 mm right upper lobe pulmonary nodule, unchanged since October of 2019 and certainly benign.  Aortic Atherosclerosis (ICD10-I70.0) and Emphysema (ICD10-J43.9). Electronically Signed   By: Nelson Chimes M.D.   On: 03/23/2019 19:27   CT Angio Neck W and/or Wo Contrast  Result Date: 03/23/2019 CLINICAL DATA:  Vertigo.  Neck pain.  Nausea and vomiting. EXAM: CT ANGIOGRAPHY HEAD AND NECK TECHNIQUE: Multidetector CT imaging of the head and neck was performed using the standard protocol during bolus administration of intravenous contrast. Multiplanar CT image reconstructions and MIPs were obtained to evaluate the vascular anatomy. Carotid stenosis measurements (when applicable) are obtained utilizing NASCET criteria, using the distal internal carotid diameter as the denominator. CONTRAST:  67mL OMNIPAQUE IOHEXOL 350 MG/ML SOLN COMPARISON:  MRI 03/12/2017 FINDINGS: CT HEAD FINDINGS Brain: No sign of acute or subacute infarction. Chronic small-vessel ischemic changes of the cerebral hemispheric  white matter. No mass lesion, hemorrhage, hydrocephalus or extra-axial collection. Vascular: There is atherosclerotic calcification of the major vessels at the base of the brain. Skull: Negative Sinuses: Clear Orbits: Normal Review of the MIP images confirms the above findings CTA NECK FINDINGS Aortic arch: Aortic atherosclerosis. No aneurysm or dissection. Branching pattern is normal without origin stenosis. Right carotid system: Common carotid artery shows some atherosclerotic plaque but is widely patent to the bifurcation region. Calcified plaque at the carotid bifurcation and ICA bulb. Minimal diameter in the distal bulb is 1 mm. Compared to a more distal cervical ICA diameter of 4.3 mm, this indicates an 80% stenosis. Left carotid system: Common carotid artery shows some atherosclerotic plaque but is widely patent to the bifurcation region. Calcified plaque at the carotid bifurcation and ICA bulb. Minimal diameter at the distal bulb is 1 mm. Compared to a more distal cervical ICA diameter of 4.3 mm, this indicates an 80% stenosis. Vertebral arteries: There is atherosclerotic plaque at both vertebral artery origins. Stenosis estimated at 30-50% on each side. Beyond the origins, there are small areas of plaque along the course through the cervical region but no stenosis. Skeleton: Mild cervical spondylosis and facet arthritis. Other neck: No mass or lymphadenopathy. Upper chest: Small mediastinal lymph nodes, not likely pathologic. Emphysematous change in the upper lobes. 15 mm nodule in the right upper lobe this is unchanged compared to a previous chest CT in October of 0109 and therefore certainly benign. Review of the MIP images confirms the above findings CTA HEAD FINDINGS Anterior circulation: Both internal carotid arteries are patent through the skull base and siphon regions. There is atherosclerotic calcification in both carotid siphon regions with stenosis estimated at 30-50%. No high-grade siphon  stenosis. The anterior and middle cerebral vessels are patent. No large or medium vessel occlusion is seen. No correctable proximal stenosis. No aneurysm. Posterior circulation: Both vertebral arteries are patent through the foramen magnum to the basilar. No basilar stenosis. Posterior circulation branch vessels are normal. Dominant left PICA. Venous sinuses: Patent and normal. Anatomic variants: None Review of the MIP images confirms the above findings IMPRESSION: Atherosclerotic disease at both carotid bifurcations. Extensive calcified plaque at the bifurcations and ICA bulb on each side. Maximal stenosis of 80% in each ICA bulb region. 30-50% stenosis at both vertebral artery origins. 30-50% stenosis in both carotid siphon regions. No intracranial large or medium vessel occlusion. 15 mm right upper lobe pulmonary nodule, unchanged since October of 3235 and certainly benign. Aortic Atherosclerosis (ICD10-I70.0) and Emphysema (ICD10-J43.9). Electronically Signed   By: Nelson Chimes M.D.   On: 03/23/2019 19:27   DG Chest Portable 1 View  Result Date: 03/23/2019 CLINICAL DATA:  Cough, dizziness, vertigo,  vomiting EXAM: PORTABLE CHEST 1 VIEW COMPARISON:  03/12/2017 FINDINGS: Single frontal view of the chest demonstrates an unremarkable cardiac silhouette. Chronic interstitial scarring without airspace disease, effusion, or pneumothorax. 12 x 9 mm right upper lobe nodule projecting over the right anterior third rib is unchanged since 2018. No acute bony abnormalities. IMPRESSION: 1. Stable right upper lobe pulmonary nodule. 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 2. No acute intrathoracic process. Electronically Signed   By: Sharlet Salina M.D.   On: 03/23/2019 16:53      Vanetta Mulders, MD 03/23/19 2240

## 2019-03-24 MED ORDER — DIAZEPAM 2 MG PO TABS: 2.0000 mg | ORAL_TABLET | Freq: Two times a day (BID) | ORAL | 0 refills | Status: DC | PRN

## 2019-03-29 DIAGNOSIS — J0140 Acute pansinusitis, unspecified: Secondary | ICD-10-CM | POA: Diagnosis not present

## 2019-03-29 DIAGNOSIS — H6693 Otitis media, unspecified, bilateral: Secondary | ICD-10-CM | POA: Diagnosis not present

## 2019-03-29 DIAGNOSIS — R42 Dizziness and giddiness: Secondary | ICD-10-CM | POA: Diagnosis not present

## 2019-03-29 DIAGNOSIS — H6093 Unspecified otitis externa, bilateral: Secondary | ICD-10-CM | POA: Diagnosis not present

## 2019-03-29 DIAGNOSIS — H9203 Otalgia, bilateral: Secondary | ICD-10-CM | POA: Diagnosis not present

## 2019-04-05 ENCOUNTER — Other Ambulatory Visit: Payer: Self-pay | Admitting: Cardiology

## 2019-04-05 DIAGNOSIS — I10 Essential (primary) hypertension: Secondary | ICD-10-CM

## 2019-06-07 ENCOUNTER — Other Ambulatory Visit: Payer: Self-pay | Admitting: Cardiology

## 2019-06-07 DIAGNOSIS — I10 Essential (primary) hypertension: Secondary | ICD-10-CM

## 2019-06-13 ENCOUNTER — Ambulatory Visit: Payer: Medicare Other | Admitting: Physician Assistant

## 2019-07-05 ENCOUNTER — Other Ambulatory Visit: Payer: Self-pay | Admitting: Cardiology

## 2019-07-05 DIAGNOSIS — I10 Essential (primary) hypertension: Secondary | ICD-10-CM

## 2019-10-09 ENCOUNTER — Other Ambulatory Visit: Payer: Self-pay | Admitting: Cardiology

## 2019-10-09 DIAGNOSIS — I10 Essential (primary) hypertension: Secondary | ICD-10-CM

## 2020-09-13 ENCOUNTER — Ambulatory Visit: Payer: Medicare Other | Admitting: Neurology

## 2020-09-16 ENCOUNTER — Encounter: Payer: Self-pay | Admitting: Neurology

## 2020-10-21 NOTE — Progress Notes (Signed)
NEUROLOGY CONSULTATION NOTE  William Hogan Hogan. MRN: [id number] DOB: [birth year]  Referring provider: Gladstone Lighter, FNP Primary care provider: Aida Puffer, MD  Reason for consult:  vertigo  Assessment/Plan:   Benign paroxysmal positional vertigo, likely left sided  Hypertension  1  advised not to take meclizine daily and only treat as needed with a vertigo attack. 2  If episodes become more frequent or do not resolve, consider vestibular rehab 3  Follow up with PCP regarding blood pressure 4  Follow up as needed   Subjective:  William Hogan is a 70 year old male with HTN who presents for vertigo.  History supplemented by referring provider's note.  MRI of brain from Feb 2019 and Feb 2021 and CTA head and neck from Feb 2021 personally reviewed.  He started having episodes of dizziness around 2018.  It is a sudden onset of spinning sensation to the left, but sometimes it starts out as a sense of movement that progresses to spinning.  Vision is blurred.  He often because nauseous and vomits.  He has difficulty standing and ambulating.  No double vision, tinnitus, otalgia, aural fullness, or headache.  Symptoms fluctuate over the course of a few hours and aggravated by movement.  He typically will need to lay down to rest.  Turning over in bed may aggravate it.  He usually gets it three times a year.  He has been to the ED several occasions for spells.  MRI of brain on 03/12/2017 personally reviewed showed chronic small vessel ischemic changes with small remote right pontine lacunar infarct but no acute findings.  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  CTA of head and neck showed atherosclerotic disease with 80% stenosis at each ICA bulbs, 30-50% stenosis in both carotid siphons and 30-50% stenosis at both vertebral artery origins.  No intracranial  posterior circulation stenosis.  He was treated for otitis media which helped for a little while but he has since had recurrence.  He treats with meclizine.  However, he takes meclizine daily.  PAST MEDICAL HISTORY: Past Medical History:  Diagnosis Date   High cholesterol    Hypertension     PAST SURGICAL HISTORY: No past surgical history on file.  MEDICATIONS: Current Outpatient Medications on File Prior to Visit  Medication Sig Dispense Refill   amLODipine (NORVASC) 5 MG tablet Take 1 tablet (5 mg total) by mouth daily. 30 tablet 2   atorvastatin (LIPITOR) 80 MG tablet Take 1 tablet (80 mg total) by mouth daily. 90 tablet 1   bisoprolol (ZEBETA) 5 MG tablet TAKE 1 TABLET BY MOUTH DAILY. 90 tablet 2   diazepam (VALIUM) 2 MG tablet Take 1 tablet (2 mg total) by mouth every 12 (twelve) hours as needed (dizziness). 10 tablet 0   lisinopril (ZESTRIL) 40 MG tablet TAKE 1 TABLET BY MOUTH DAILY. 90 tablet 1   meclizine (ANTIVERT) 25 MG tablet Take 1 tablet (25 mg total) by mouth 3 (three) times daily as needed for dizziness. 30 tablet 0   No current facility-administered medications on file prior to visit.    ALLERGIES: Allergies  Allergen Reactions   Clonidine Derivatives Other (See Comments)    Dizzy and sleepy    FAMILY HISTORY: Family History  Problem Relation Age of Onset   Stroke Father     Objective:  Blood pressure (!) 190/85, pulse  80, height 5\' 11"  (1.803 m), weight 188 lb (85.3 kg), SpO2 91 %. General: No acute distress.  Patient appears well-groomed.   Head:  Normocephalic/atraumatic Eyes:  fundi examined but not visualized Neck: supple, no paraspinal tenderness, full range of motion Back: No paraspinal tenderness Heart: regular rate and rhythm Lungs: Clear to auscultation bilaterally. Vascular: No carotid bruits. Neurological Exam: Mental status: alert and oriented to person, place, and time, recent and remote memory intact, fund of knowledge intact, attention  and concentration intact, speech fluent and not dysarthric, language intact. Cranial nerves: CN I: not tested CN II: pupils equal, round and reactive to light, visual fields intact CN III, IV, VI:  full range of motion, no nystagmus, no ptosis CN V: facial sensation intact. CN VII: upper and lower face symmetric CN VIII: hearing intact CN IX, X: gag intact, uvula midline CN XI: sternocleidomastoid and trapezius muscles intact CN XII: tongue midline Bulk & Tone: normal, no fasciculations. Motor:  muscle strength 5/5 throughout Sensation:  Pinprick, temperature and vibratory sensation intact. Deep Tendon Reflexes:  2+ throughout,  toes downgoing.   Finger to nose testing:  Without dysmetria.   Heel to shin:  Without dysmetria.   Gait:  Normal station and stride.  Romberg negative.    Thank you for allowing me to take part in the care of this patient.  , DO  CC:  Shon Millet, MD  Aida Puffer, FNP

## 2020-10-22 ENCOUNTER — Encounter: Payer: Self-pay | Admitting: Neurology

## 2020-10-22 ENCOUNTER — Ambulatory Visit: Payer: Medicare Other | Admitting: Neurology

## 2020-10-22 ENCOUNTER — Other Ambulatory Visit: Payer: Self-pay

## 2020-10-22 VITALS — BP 190/85 | HR 80 | Ht 71.0 in | Wt 188.0 lb

## 2020-10-22 DIAGNOSIS — I10 Essential (primary) hypertension: Secondary | ICD-10-CM

## 2020-10-22 DIAGNOSIS — H8110 Benign paroxysmal vertigo, unspecified ear: Secondary | ICD-10-CM | POA: Diagnosis not present

## 2020-10-22 NOTE — Patient Instructions (Signed)
I think the dizziness is an inner ear issue.  If it becomes a more frequent problem, then I would recommend physical therapy called vestibular rehab.  I would not take meclizine daily.  Only take it when you really need it.  Follow up with your primary doctor.

## 2020-12-05 DIAGNOSIS — H9193 Unspecified hearing loss, bilateral: Secondary | ICD-10-CM | POA: Insufficient documentation

## 2020-12-23 ENCOUNTER — Telehealth: Payer: Self-pay | Admitting: Neurology

## 2020-12-23 NOTE — Telephone Encounter (Signed)
[employer] called with a telephone number to call for a list of in-network audiologists: [phone].  The patient is requesting a referral to an audiologist.

## 2020-12-25 NOTE — Telephone Encounter (Signed)
Patient not sure what the lady taking about.   He has an appt in January.

## 2021-06-18 DIAGNOSIS — R0789 Other chest pain: Secondary | ICD-10-CM | POA: Diagnosis not present

## 2021-06-18 DIAGNOSIS — R079 Chest pain, unspecified: Secondary | ICD-10-CM | POA: Diagnosis not present

## 2021-06-18 DIAGNOSIS — R110 Nausea: Secondary | ICD-10-CM | POA: Diagnosis not present

## 2021-06-18 DIAGNOSIS — Z743 Need for continuous supervision: Secondary | ICD-10-CM | POA: Diagnosis not present

## 2021-06-18 DIAGNOSIS — R6889 Other general symptoms and signs: Secondary | ICD-10-CM | POA: Diagnosis not present

## 2021-06-18 DIAGNOSIS — R112 Nausea with vomiting, unspecified: Secondary | ICD-10-CM | POA: Diagnosis not present

## 2021-06-18 DIAGNOSIS — R404 Transient alteration of awareness: Secondary | ICD-10-CM | POA: Diagnosis not present

## 2021-06-18 DIAGNOSIS — R42 Dizziness and giddiness: Secondary | ICD-10-CM | POA: Diagnosis not present

## 2021-07-04 ENCOUNTER — Encounter: Payer: Self-pay | Admitting: Nurse Practitioner

## 2021-07-04 ENCOUNTER — Ambulatory Visit (INDEPENDENT_AMBULATORY_CARE_PROVIDER_SITE_OTHER): Payer: Medicare Other | Admitting: Nurse Practitioner

## 2021-07-04 VITALS — BP 178/78 | HR 69 | Temp 97.5°F | Ht 70.87 in | Wt 175.1 lb

## 2021-07-04 DIAGNOSIS — Z7689 Persons encountering health services in other specified circumstances: Secondary | ICD-10-CM

## 2021-07-04 DIAGNOSIS — R42 Dizziness and giddiness: Secondary | ICD-10-CM | POA: Diagnosis not present

## 2021-07-04 DIAGNOSIS — I10 Essential (primary) hypertension: Secondary | ICD-10-CM | POA: Diagnosis not present

## 2021-07-04 DIAGNOSIS — H65493 Other chronic nonsuppurative otitis media, bilateral: Secondary | ICD-10-CM

## 2021-07-04 MED ORDER — HYDROCORTISONE-ACETIC ACID 1-2 % OT SOLN: 4.0000 [drp] | Freq: Two times a day (BID) | OTIC | 1 refills | Status: DC

## 2021-07-04 MED ORDER — AMLODIPINE BESYLATE 5 MG PO TABS: 5.0000 mg | ORAL_TABLET | Freq: Every day | ORAL | 0 refills | Status: DC

## 2021-07-04 NOTE — Progress Notes (Unsigned)
New Patient Office Visit  Subjective    Patient ID: William Hogan., male    DOB: 1950/09/14  Age: 71 y.o. MRN: [id number]  CC:  Chief Complaint  Patient presents with   New Patient (Initial Visit)    HPI William GRUSZKA Sr. presents to establish care The patient presents as new patient. Coming from another local provider who has retired and closed his office.  -main complaint is vertigo. Ongoing for 4 years.  -states that he has "normal vertigo" and has "high end" vertigo.  -when he has "high end" vertigo, he states that everything spins. He states that he gets nauseated and has multiple vomiting spells. Ends up laying in bed because movement makes this worse.  -meclizine does help.  -did have MRI 04/02/2019 - IMPRESSION: No acute finding by MRI. 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 Moderate chronic small-vessel ischemic changes of the cerebral hemispheric white matter.  Outpatient Encounter Medications as of 07/04/2021  Medication Sig   amLODipine (NORVASC) 5 MG tablet Take 1 tablet (5 mg total) by mouth daily. (Patient not taking: Reported on 10/22/2020)   atorvastatin (LIPITOR) 80 MG tablet Take 1 tablet (80 mg total) by mouth daily.   bisoprolol (ZEBETA) 5 MG tablet TAKE 1 TABLET BY MOUTH DAILY.   cetirizine (ZYRTEC) 10 MG tablet Take 10 mg by mouth daily.   diazepam (VALIUM) 2 MG tablet Take 1 tablet (2 mg total) by mouth every 12 (twelve) hours as needed (dizziness). (Patient not taking: Reported on 10/22/2020)   lisinopril (ZESTRIL) 40 MG tablet TAKE 1 TABLET BY MOUTH DAILY.   meclizine (ANTIVERT) 25 MG tablet Take 1 tablet (25 mg total) by mouth 3 (three) times daily as needed for dizziness.   No facility-administered encounter medications on file as of 07/04/2021.    Past Medical History:  Diagnosis Date   High cholesterol    Hypertension     History reviewed. No pertinent surgical history.  Family History  Problem Relation Age of Onset   Stroke Father      Social History   Socioeconomic History   Marital status: Married    Spouse name: Not on file   Number of children: 3   Years of education: Not on file   Highest education level: Not on file  Occupational History   Not on file  Tobacco Use   Smoking status: Former    Packs/day: 1.50    Years: 40.00    Pack years: 60.00    Types: Cigarettes    Quit date: 09/05/2016    Years since quitting: 4.8   Smokeless tobacco: Never  Vaping Use   Vaping Use: Never used  Substance and Sexual Activity   Alcohol use: No    Comment: Quit drinking alcohol 10/20/2010   Drug use: No   Sexual activity: Not Currently  Other Topics Concern   Not on file  Social History Narrative   Not on file   Social Determinants of Health   Financial Resource Strain: Not on file  Food Insecurity: Not on file  Transportation Needs: Not on file  Physical Activity: Not on file  Stress: Not on file  Social Connections: Not on file  Intimate Partner Violence: Not on file    ROS      Objective    BP (!) 187/80   Pulse 69   Temp (!) 97.5 F (36.4 C)   Ht 5' 10.87" (1.8 m)   Wt 175 lb 1.9 oz (79.4 kg)  SpO2 96%   BMI 24.52 kg/m   Physical Exam  {Labs (Optional):23779}    Assessment & Plan:   Problem List Items Addressed This Visit   None   No follow-ups on file.   Carlean Jews, NP

## 2021-07-07 DIAGNOSIS — H6690 Otitis media, unspecified, unspecified ear: Secondary | ICD-10-CM | POA: Insufficient documentation

## 2021-07-30 ENCOUNTER — Other Ambulatory Visit: Payer: Self-pay | Admitting: Nurse Practitioner

## 2021-07-30 DIAGNOSIS — I10 Essential (primary) hypertension: Secondary | ICD-10-CM

## 2021-08-15 ENCOUNTER — Ambulatory Visit (INDEPENDENT_AMBULATORY_CARE_PROVIDER_SITE_OTHER): Payer: Medicare Other | Admitting: Nurse Practitioner

## 2021-08-15 ENCOUNTER — Other Ambulatory Visit: Payer: Self-pay | Admitting: Nurse Practitioner

## 2021-08-15 ENCOUNTER — Encounter: Payer: Self-pay | Admitting: Nurse Practitioner

## 2021-08-15 VITALS — BP 150/82 | HR 77 | Temp 97.7°F | Ht 71.0 in | Wt 177.0 lb

## 2021-08-15 DIAGNOSIS — E7800 Pure hypercholesterolemia, unspecified: Secondary | ICD-10-CM

## 2021-08-15 DIAGNOSIS — H8190 Unspecified disorder of vestibular function, unspecified ear: Secondary | ICD-10-CM | POA: Insufficient documentation

## 2021-08-15 DIAGNOSIS — Z0000 Encounter for general adult medical examination without abnormal findings: Secondary | ICD-10-CM | POA: Diagnosis not present

## 2021-08-15 DIAGNOSIS — I10 Essential (primary) hypertension: Secondary | ICD-10-CM

## 2021-08-15 DIAGNOSIS — R42 Dizziness and giddiness: Secondary | ICD-10-CM

## 2021-08-15 MED ORDER — MECLIZINE HCL 25 MG PO TABS: 25.0000 mg | ORAL_TABLET | Freq: Three times a day (TID) | ORAL | 2 refills | Status: DC | PRN

## 2021-08-15 NOTE — Patient Instructions (Signed)
Preventive Care 65 Years and Older, Male Preventive care refers to lifestyle choices and visits with your health care provider that can promote health and wellness. Preventive care visits are also called wellness exams. What can I expect for my preventive care visit? Counseling During your preventive care visit, your health care provider may ask about your: Medical history, including: Past medical problems. Family medical history. History of falls. Current health, including: Emotional well-being. Home life and relationship well-being. Sexual activity. Memory and ability to understand (cognition). Lifestyle, including: Alcohol, nicotine or tobacco, and drug use. Access to firearms. Diet, exercise, and sleep habits. Work and work environment. Sunscreen use. Safety issues such as seatbelt and bike helmet use. Physical exam Your health care provider will check your: Height and weight. These may be used to calculate your BMI (body mass index). BMI is a measurement that tells if you are at a healthy weight. Waist circumference. This measures the distance around your waistline. This measurement also tells if you are at a healthy weight and may help predict your risk of certain diseases, such as type 2 diabetes and high blood pressure. Heart rate and blood pressure. Body temperature. Skin for abnormal spots. What immunizations do I need?  Vaccines are usually given at various ages, according to a schedule. Your health care provider will recommend vaccines for you based on your age, medical history, and lifestyle or other factors, such as travel or where you work. What tests do I need? Screening Your health care provider may recommend screening tests for certain conditions. This may include: Lipid and cholesterol levels. Diabetes screening. This is done by checking your blood sugar (glucose) after you have not eaten for a while (fasting). Hepatitis C test. Hepatitis B test. HIV (human  immunodeficiency virus) test. STI (sexually transmitted infection) testing, if you are at risk. Lung cancer screening. Colorectal cancer screening. Prostate cancer screening. Abdominal aortic aneurysm (AAA) screening. You may need this if you are a current or former smoker. Talk with your health care provider about your test results, treatment options, and if necessary, the need for more tests. Follow these instructions at home: Eating and drinking  Eat a diet that includes fresh fruits and vegetables, whole grains, lean protein, and low-fat dairy products. Limit your intake of foods with high amounts of sugar, saturated fats, and salt. Take vitamin and mineral supplements as recommended by your health care provider. Do not drink alcohol if your health care provider tells you not to drink. If you drink alcohol: Limit how much you have to 0-2 drinks a day. Know how much alcohol is in your drink. In the [place] one drink equals one 12 oz bottle of beer (355 mL), one 5 oz glass of wine (148 mL), or one 1 oz glass of hard liquor (44 mL). Lifestyle Brush your teeth every morning and night with fluoride toothpaste. Floss one time each day. Exercise for at least 30 minutes 5 or more days each week. Do not use any products that contain nicotine or tobacco. These products include cigarettes, chewing tobacco, and vaping devices, such as e-cigarettes. If you need help quitting, ask your health care provider. Do not use drugs. If you are sexually active, practice safe sex. Use a condom or other form of protection to prevent STIs. Take aspirin only as told by your health care provider. Make sure that you understand how much to take and what form to take. Work with your health care provider to find out whether it is safe   and beneficial for you to take aspirin daily. Ask your health care provider if you need to take a cholesterol-lowering medicine (statin). Find healthy ways to manage stress, such  as: Meditation, yoga, or listening to music. Journaling. Talking to a trusted person. Spending time with friends and family. Safety Always wear your seat belt while driving or riding in a vehicle. Do not drive: If you have been drinking alcohol. Do not ride with someone who has been drinking. When you are tired or distracted. While texting. If you have been using any mind-altering substances or drugs. Wear a helmet and other protective equipment during sports activities. If you have firearms in your house, make sure you follow all gun safety procedures. Minimize exposure to UV radiation to reduce your risk of skin cancer. What's next? Visit your health care provider once a year for an annual wellness visit. Ask your health care provider how often you should have your eyes and teeth checked. Stay up to date on all vaccines. This information is not intended to replace advice given to you by your health care provider. Make sure you discuss any questions you have with your health care provider. Document Revised: 07/24/2020 Document Reviewed: 07/24/2020 Elsevier Patient Education  2023 Elsevier Inc.  

## 2021-08-15 NOTE — Progress Notes (Signed)
Subjective:   William EDERER Sr. is a 71 y.o. male who presents for Medicare Annual/Subsequent preventive examination.  Review of Systems    Refer to PCP    I connected with  William Mouton Sr. on 08/15/21 by an in office application and verified that I am speaking with the correct person using two identifiers.   I discussed the limitations, risks, security and privacy concerns of performing an evaluation and management service by telephone and the availability of in person appointments. I also discussed with the patient that there may be a patient responsible charge related to this service. The patient expressed understanding and verbally consented to this telephonic visit.  Location of Patient: Restaurant manager, fast food of Provider: Office  List any persons and their role that are participating in the visit with the patient.  Brinlyn Cena, CMA      Objective:    There were no vitals filed for this visit. There is no height or weight on file to calculate BMI.     10/22/2020    2:31 PM 09/29/2017    3:25 PM 03/12/2017    3:49 PM 09/06/2016   12:48 AM 09/05/2016    3:35 PM  Advanced Directives  Does Patient Have a Medical Advance Directive? Yes No No No No  Would patient like information on creating a medical advance directive?  No - Patient declined No - Patient declined No - Patient declined     Current Medications (verified) Outpatient Encounter Medications as of 08/15/2021  Medication Sig   acetic acid-hydrocortisone (VOSOL-HC) OTIC solution Place 4 drops into both ears 2 (two) times daily.   amLODipine (NORVASC) 5 MG tablet TAKE 1 TABLET (5 MG TOTAL) BY MOUTH DAILY.   atorvastatin (LIPITOR) 80 MG tablet Take 1 tablet (80 mg total) by mouth daily.   bisoprolol (ZEBETA) 5 MG tablet TAKE 1 TABLET BY MOUTH DAILY.   cetirizine (ZYRTEC) 10 MG tablet Take 10 mg by mouth daily.   lisinopril (ZESTRIL) 40 MG tablet TAKE 1 TABLET BY MOUTH DAILY.   meclizine (ANTIVERT) 25 MG tablet  Take 1 tablet (25 mg total) by mouth 3 (three) times daily as needed for dizziness.   No facility-administered encounter medications on file as of 08/15/2021.    Allergies (verified) Clonidine derivatives   History: Past Medical History:  Diagnosis Date   High cholesterol    Hypertension    No past surgical history on file. Family History  Problem Relation Age of Onset   Stroke Father    Social History   Socioeconomic History   Marital status: Married    Spouse name: Not on file   Number of children: 3   Years of education: Not on file   Highest education level: Not on file  Occupational History   Not on file  Tobacco Use   Smoking status: Former    Packs/day: 1.50    Years: 40.00    Total pack years: 60.00    Types: Cigarettes    Quit date: 09/05/2016    Years since quitting: 4.9   Smokeless tobacco: Never  Vaping Use   Vaping Use: Never used  Substance and Sexual Activity   Alcohol use: No    Comment: Quit drinking alcohol 10/20/2010   Drug use: No   Sexual activity: Not Currently  Other Topics Concern   Not on file  Social History Narrative   Not on file   Social Determinants of Health   Financial Resource Strain: Not on  file  Food Insecurity: Not on file  Transportation Needs: Not on file  Physical Activity: Not on file  Stress: Not on file  Social Connections: Not on file    Tobacco Counseling Counseling given: Not Answered   Clinical Intake:                 Diabetic?No         Activities of Daily Living    07/04/2021   10:39 AM  In your present state of health, do you have any difficulty performing the following activities:  Hearing? 1  Vision? 1  Difficulty concentrating or making decisions? 0  Walking or climbing stairs? 1  Dressing or bathing? 0  Doing errands, shopping? 1    Patient Care Team: Carlean Jews, NP as PCP - General (Family Medicine) Elder Negus, MD as Consulting Physician  (Cardiology) Lanelle Bal, MD (Inactive) as Resident (Internal Medicine) Aida Puffer, MD as Referring Physician (Family Medicine)  Indicate any recent Medical Services you may have received from other than [facility] providers in the past year (date may be approximate).     Assessment:   This is a routine wellness examination for [name].  Hearing/Vision screen No results found.  Dietary issues and exercise activities discussed:     Goals Addressed   None   Depression Screen    07/04/2021   10:39 AM 03/14/2019    2:19 PM 12/12/2018    1:41 PM 11/24/2018    9:51 AM 08/24/2018    1:03 PM 02/23/2018    9:24 AM 11/29/2017    3:00 PM  PHQ 2/9 Scores  PHQ - 2 Score 0 0 0 0 0 0 0  PHQ- 9 Score 3 0 0 0 0 0 0    Fall Risk    10/22/2020    2:31 PM 08/24/2018    1:05 PM 10/20/2017    8:13 AM  Fall Risk   Falls in the past year? 0 0 No  Number falls in past yr: 0    Injury with Fall? 0    Follow up  Falls evaluation completed     FALL RISK PREVENTION PERTAINING TO THE HOME:  Any stairs in or around the home? No  If so, are there any without handrails? No  Home free of loose throw rugs in walkways, pet beds, electrical cords, etc? Yes  Adequate lighting in your home to reduce risk of falls? Yes   ASSISTIVE DEVICES UTILIZED TO PREVENT FALLS:  Life alert? No  Use of a cane, walker or w/c? No  Grab bars in the bathroom? Yes  Shower chair or bench in shower? Yes  Elevated toilet seat or a handicapped toilet? Yes   TIMED UP AND GO:  Was the test performed? Yes .  Length of time to ambulate 10 feet: 12 sec.   Gait slow and steady without use of assistive device  Cognitive Function:        08/24/2018    1:06 PM  6CIT Screen  What Year? 0 points  What month? 0 points  What time? 0 points  Count back from 20 0 points  Months in reverse 0 points  Repeat phrase 6 points  Total Score 6 points    Immunizations Immunization History  Administered Date(s)  Administered   Fluad Quad(high Dose 65+) 11/24/2018   Influenza, High Dose Seasonal PF 01/14/2018   Pneumococcal Conjugate-13 09/29/2017    TDAP status: Due, Education has been provided regarding the importance of  this vaccine. Advised may receive this vaccine at local pharmacy or Health Dept. Aware to provide a copy of the vaccination record if obtained from local pharmacy or Health Dept. Verbalized acceptance and understanding.  Flu Vaccine status: Up to date  Pneumococcal vaccine status: Due, Education has been provided regarding the importance of this vaccine. Advised may receive this vaccine at local pharmacy or Health Dept. Aware to provide a copy of the vaccination record if obtained from local pharmacy or Health Dept. Verbalized acceptance and understanding.  Covid-19 vaccine status: Completed vaccines  Qualifies for Shingles Vaccine? Yes   Zostavax completed No   Shingrix Completed?: No.    Education has been provided regarding the importance of this vaccine. Patient has been advised to call insurance company to determine out of pocket expense if they have not yet received this vaccine. Advised may also receive vaccine at local pharmacy or Health Dept. Verbalized acceptance and understanding.  Screening Tests Health Maintenance  Topic Date Due   COVID-19 Vaccine (1) Never done   Hepatitis C Screening  Never done   TETANUS/TDAP  Never done   Zoster Vaccines- Shingrix (1 of 2) Never done   COLONOSCOPY (Pts 45-11yrs Insurance coverage will need to be confirmed)  Never done   Pneumonia Vaccine 61+ Years old (2 - PPSV23 if available, else PCV20) 09/30/2018   INFLUENZA VACCINE  09/09/2021   HPV VACCINES  Aged Out    Health Maintenance  Health Maintenance Due  Topic Date Due   COVID-19 Vaccine (1) Never done   Hepatitis C Screening  Never done   TETANUS/TDAP  Never done   Zoster Vaccines- Shingrix (1 of 2) Never done   COLONOSCOPY (Pts 45-92yrs Insurance coverage will need to  be confirmed)  Never done   Pneumonia Vaccine 47+ Years old (2 - PPSV23 if available, else PCV20) 09/30/2018    Colorectal: Patient declined  Lung Cancer Screening: (Low Dose CT Chest recommended if Age 38-80 years, 30 pack-year currently smoking OR have quit w/in 15years.) does not qualify.   Lung Cancer Screening Referral:   Additional Screening:  Hepatitis C Screening: does qualify; Patient declined  Vision Screening: Recommended annual ophthalmology exams for early detection of glaucoma and other disorders of the eye. Is the patient up to date with their annual eye exam?  Yes  Who is the provider or what is the name of the office in which the patient attends annual eye exams? Dr. Manus Rudd If pt is not established with a provider, would they like to be referred to a provider to establish care? No .   Dental Screening: Recommended annual dental exams for proper oral hygiene  Community Resource Referral / Chronic Care Management: CRR required this visit?  No   CCM required this visit?  No      Plan:     I have personally reviewed and noted the following in the patient's chart:   Medical and social history Use of alcohol, tobacco or illicit drugs  Current medications and supplements including opioid prescriptions. Patient is not currently taking opioid prescriptions. Functional ability and status Nutritional status Physical activity Advanced directives List of other physicians Hospitalizations, surgeries, and ER visits in previous 12 months Vitals Screenings to include cognitive, depression, and falls Referrals and appointments  In addition, I have reviewed and discussed with patient certain preventive protocols, quality metrics, and best practice recommendations. A written personalized care plan for preventive services as well as general preventive health recommendations were provided to patient.  Laren Everts Roza Creamer, CMA   08/15/2021   Nurse Notes: Face to Face  25 minutes   Mr. Lambert , Thank you for taking time to come for your Medicare Wellness Visit. I appreciate your ongoing commitment to your health goals. Please review the following plan we discussed and let me know if I can assist you in the future.   These are the goals we discussed:  Goals   None    Shingles Vaccine Tdap Vaccine Pneumonia Vaccine  This is a list of the screening recommended for you and due dates:  Health Maintenance  Topic Date Due   COVID-19 Vaccine (1) Never done   Hepatitis C Screening: USPSTF Recommendation to screen - Ages 74-79 yo.  Never done   Tetanus Vaccine  Never done   Zoster (Shingles) Vaccine (1 of 2) Never done   Colon Cancer Screening  Never done   Pneumonia Vaccine (2 - PPSV23 if available, else PCV20) 09/30/2018   Flu Shot  09/09/2021   HPV Vaccine  Aged Out

## 2021-08-16 LAB — COMPREHENSIVE METABOLIC PANEL
ALT: 18 IU/L (ref 0–44)
AST: 28 IU/L (ref 0–40)
Albumin/Globulin Ratio: 1.7 (ref 1.2–2.2)
Albumin: 4.6 g/dL (ref 3.7–4.7)
Alkaline Phosphatase: 86 IU/L (ref 44–121)
BUN/Creatinine Ratio: 13 (ref 10–24)
BUN: 15 mg/dL (ref 8–27)
Bilirubin Total: 0.8 mg/dL (ref 0.0–1.2)
CO2: 23 mmol/L (ref 20–29)
Calcium: 9.5 mg/dL (ref 8.6–10.2)
Chloride: 98 mmol/L (ref 96–106)
Creatinine, Ser: 1.19 mg/dL (ref 0.76–1.27)
Globulin, Total: 2.7 g/dL (ref 1.5–4.5)
Glucose: 106 mg/dL — ABNORMAL HIGH (ref 70–99)
Potassium: 4.2 mmol/L (ref 3.5–5.2)
Sodium: 135 mmol/L (ref 134–144)
Total Protein: 7.3 g/dL (ref 6.0–8.5)
eGFR: 65 mL/min/{1.73_m2} (ref >59)

## 2021-08-16 LAB — LIPID PANEL
Chol/HDL Ratio: 2.9 ratio (ref 0.0–5.0)
Cholesterol, Total: 138 mg/dL (ref 100–199)
HDL: 47 mg/dL (ref >39)
LDL Chol Calc (NIH): 76 mg/dL (ref 0–99)
Triglycerides: 74 mg/dL (ref 0–149)
VLDL Cholesterol Cal: 15 mg/dL (ref 5–40)

## 2021-08-16 LAB — CBC
Hematocrit: 48.8 % (ref 37.5–51.0)
Hemoglobin: 16.3 g/dL (ref 13.0–17.7)
MCH: 29.4 pg (ref 26.6–33.0)
MCHC: 33.4 g/dL (ref 31.5–35.7)
MCV: 88 fL (ref 79–97)
Platelets: 287 10*3/uL (ref 150–450)
RBC: 5.54 x10E6/uL (ref 4.14–5.80)
RDW: 13.1 % (ref 11.6–15.4)
WBC: 9.1 10*3/uL (ref 3.4–10.8)

## 2021-08-16 LAB — HEMOGLOBIN A1C
Est. average glucose Bld gHb Est-mCnc: 108 mg/dL
Hgb A1c MFr Bld: 5.4 % (ref 4.8–5.6)

## 2021-08-16 LAB — TSH: TSH: 1.66 u[IU]/mL (ref 0.450–4.500)

## 2021-08-17 NOTE — Progress Notes (Signed)
Please let the patient know that his labs looked very good.  Thanks so much.   -HB

## 2021-08-28 ENCOUNTER — Other Ambulatory Visit: Payer: Self-pay | Admitting: Nurse Practitioner

## 2021-08-28 DIAGNOSIS — I10 Essential (primary) hypertension: Secondary | ICD-10-CM

## 2021-10-01 ENCOUNTER — Other Ambulatory Visit: Payer: Self-pay | Admitting: Nurse Practitioner

## 2021-10-01 DIAGNOSIS — I10 Essential (primary) hypertension: Secondary | ICD-10-CM

## 2021-10-16 ENCOUNTER — Other Ambulatory Visit: Payer: Self-pay | Admitting: Nurse Practitioner

## 2021-10-16 DIAGNOSIS — R42 Dizziness and giddiness: Secondary | ICD-10-CM

## 2021-10-30 ENCOUNTER — Other Ambulatory Visit: Payer: Self-pay | Admitting: Nurse Practitioner

## 2021-10-30 DIAGNOSIS — R42 Dizziness and giddiness: Secondary | ICD-10-CM

## 2021-10-30 DIAGNOSIS — I10 Essential (primary) hypertension: Secondary | ICD-10-CM

## 2021-11-03 ENCOUNTER — Other Ambulatory Visit: Payer: Self-pay

## 2021-11-03 ENCOUNTER — Telehealth: Payer: Self-pay

## 2021-11-03 DIAGNOSIS — R42 Dizziness and giddiness: Secondary | ICD-10-CM

## 2021-11-03 DIAGNOSIS — I10 Essential (primary) hypertension: Secondary | ICD-10-CM

## 2021-11-03 MED ORDER — AMLODIPINE BESYLATE 5 MG PO TABS: 5.0000 mg | ORAL_TABLET | Freq: Every day | ORAL | 0 refills | Status: DC

## 2021-11-03 MED ORDER — MECLIZINE HCL 25 MG PO TABS: 25.0000 mg | ORAL_TABLET | Freq: Three times a day (TID) | ORAL | 0 refills | Status: DC

## 2021-11-03 NOTE — Telephone Encounter (Signed)
Wonderful. Thank you.

## 2021-11-03 NOTE — Telephone Encounter (Signed)
Rxs were sent to pharmacy

## 2021-11-03 NOTE — Telephone Encounter (Signed)
Patient called office about getting his  amLODipine, and meclizine sent to his pharmacy, patient states that his is out of medications, please advise, thanks@

## 2021-11-17 ENCOUNTER — Telehealth: Payer: Self-pay | Admitting: Nurse Practitioner

## 2021-11-17 DIAGNOSIS — R42 Dizziness and giddiness: Secondary | ICD-10-CM

## 2021-11-18 ENCOUNTER — Other Ambulatory Visit: Payer: Self-pay | Admitting: Nurse Practitioner

## 2021-11-18 DIAGNOSIS — R42 Dizziness and giddiness: Secondary | ICD-10-CM

## 2021-11-18 MED ORDER — MECLIZINE HCL 25 MG PO TABS: 25.0000 mg | ORAL_TABLET | Freq: Three times a day (TID) | ORAL | 0 refills | Status: DC

## 2021-11-18 NOTE — Telephone Encounter (Signed)
Refilled meclizine #20 tablets and sent to [facility]

## 2021-11-18 NOTE — Telephone Encounter (Signed)
Pt is calling for the refill on: meclizine (ANTIVERT) 25 MG tablet  Pt states that he has 1 pill remaining.   Pharmacy: [place], [place] 08/15/21 ROV 12/01/21

## 2021-11-18 NOTE — Progress Notes (Signed)
Refilled meclizine #20 tablets and sent to [facility]

## 2021-12-01 ENCOUNTER — Encounter: Payer: Self-pay | Admitting: Nurse Practitioner

## 2021-12-01 ENCOUNTER — Ambulatory Visit (INDEPENDENT_AMBULATORY_CARE_PROVIDER_SITE_OTHER): Payer: Medicare Other | Admitting: Nurse Practitioner

## 2021-12-01 VITALS — BP 157/77 | HR 75 | Ht 71.0 in | Wt 176.5 lb

## 2021-12-01 DIAGNOSIS — H6123 Impacted cerumen, bilateral: Secondary | ICD-10-CM | POA: Diagnosis not present

## 2021-12-01 DIAGNOSIS — E7800 Pure hypercholesterolemia, unspecified: Secondary | ICD-10-CM | POA: Diagnosis not present

## 2021-12-01 DIAGNOSIS — I10 Essential (primary) hypertension: Secondary | ICD-10-CM | POA: Diagnosis not present

## 2021-12-01 DIAGNOSIS — R42 Dizziness and giddiness: Secondary | ICD-10-CM

## 2021-12-01 NOTE — Progress Notes (Signed)
Established patient visit   Patient: William TOWN Sr.   DOB: 1950-12-13   71 y.o. Male  MRN: [id number] Visit Date: 12/01/2021   Chief Complaint  Patient presents with   Follow-up   Hypertension   Subjective    HPI  Follow up  -htn -elevated upon arrival. He states that when he left home, blood pressure was 137/78 -dizziness  -states that he takes meclizine which does relieve dizziness.  -was told per pharmacy that he is taking too much of it.  -is getting more frequent episodes of vertigo the last few weeks. Meclizine clears up the symptoms after about 30 minutes.  -MRI head in 2021. Impressions were -  -No acute finding by MRI. 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 Moderate chronic small-vessel ischemic changes of the cerebral hemispheric white matter. CTA of neck showed -  Atherosclerotic disease at both carotid bifurcations. Extensive calcified plaque at the bifurcations and ICA bulb on each side. Maximal stenosis of 80% in each ICA bulb region. 30-50% stenosis at both vertebral artery origins. 30-50% stenosis in both carotid siphon regions.   No intracranial large or medium vessel occlusion. 15 mm right upper lobe pulmonary nodule, unchanged since October of  3790 and certainly benign. MRI of head in 2019  1. No acute intracranial abnormality. 2. Age-related cerebral atrophy with mild chronic small vessel ischemic disease. 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  He has seen multiple neurologist visits. No one can find a problem. All give him meclizine to help symptoms when they occur.  In the past, he has had inner ear infection. Following treatment, he felt better for several months.    Medications:  Outpatient Medications Prior to Visit  Medication Sig   atorvastatin (LIPITOR) 80 MG tablet Take 1 tablet (80 mg total) by mouth daily.   bisoprolol (ZEBETA) 5 MG tablet TAKE 1 TABLET BY MOUTH DAILY.   lisinopril (ZESTRIL) 40 MG tablet TAKE 1 TABLET BY  MOUTH DAILY.   [DISCONTINUED] amLODipine (NORVASC) 5 MG tablet Take 1 tablet (5 mg total) by mouth daily.   [DISCONTINUED] meclizine (ANTIVERT) 25 MG tablet Take 1 tablet (25 mg total) by mouth 3 (three) times daily.   [DISCONTINUED] acetic acid-hydrocortisone (VOSOL-HC) OTIC solution Place 4 drops into both ears 2 (two) times daily.   [DISCONTINUED] cetirizine (ZYRTEC) 10 MG tablet Take 10 mg by mouth daily.   No facility-administered medications prior to visit.    Review of Systems  Constitutional:  Negative for activity change, chills, fatigue and fever.  HENT:  Positive for ear pain and hearing loss. Negative for congestion, postnasal drip, rhinorrhea, sinus pressure, sinus pain, sneezing and sore throat.   Eyes: Negative.   Respiratory:  Negative for cough, shortness of breath and wheezing.   Cardiovascular:  Negative for chest pain and palpitations.       Elevated blood pressure   Gastrointestinal:  Negative for constipation, diarrhea, nausea and vomiting.  Endocrine: Negative for cold intolerance, heat intolerance, polydipsia and polyuria.  Genitourinary:  Negative for dysuria, frequency and urgency.  Musculoskeletal:  Negative for back pain and myalgias.  Skin:  Negative for rash.  Allergic/Immunologic: Negative for environmental allergies.  Neurological:  Positive for dizziness. Negative for weakness and headaches.  Psychiatric/Behavioral:  The patient is not nervous/anxious.      Objective     Today's Vitals   12/01/21 1429 12/01/21 1515  BP: (Abnormal) 166/75 (Abnormal) 157/77  Pulse: 75   SpO2: 97%   Weight: 176 lb 8  oz (80.1 kg)   Height: 5\' 11"  (1.803 m)    Body mass index is 24.62 kg/m.  BP Readings from Last 3 Encounters:  12/03/21 (Abnormal) 141/75  12/01/21 (Abnormal) 157/77  08/15/21 (Abnormal) 150/82    Wt Readings from Last 3 Encounters:  12/03/21 180 lb (81.6 kg)  12/01/21 176 lb 8 oz (80.1 kg)  08/15/21 177 lb (80.3 kg)    Physical Exam Vitals  and nursing note reviewed.  Constitutional:      Appearance: Normal appearance. He is well-developed.  HENT:     Head: Normocephalic and atraumatic.     Right Ear: Tympanic membrane, ear canal and external ear normal. Decreased hearing noted.     Left Ear: Tympanic membrane, ear canal and external ear normal. Decreased hearing noted.     Ears:     Comments: Earwax present in bilateral external ear canals, left side worse than right. Eyes:     Pupils: Pupils are equal, round, and reactive to light.  Neck:     Vascular: No carotid bruit.  Cardiovascular:     Rate and Rhythm: Normal rate and regular rhythm.     Pulses: Normal pulses.     Heart sounds: Normal heart sounds.  Pulmonary:     Effort: Pulmonary effort is normal.     Breath sounds: Normal breath sounds.  Abdominal:     Palpations: Abdomen is soft.  Musculoskeletal:        General: Normal range of motion.     Cervical back: Normal range of motion and neck supple.  Lymphadenopathy:     Cervical: No cervical adenopathy.  Skin:    General: Skin is warm and dry.     Capillary Refill: Capillary refill takes less than 2 seconds.  Neurological:     General: No focal deficit present.     Mental Status: He is alert and oriented to person, place, and time. Mental status is at baseline.  Psychiatric:        Mood and Affect: Mood normal.        Behavior: Behavior normal.        Thought Content: Thought content normal.        Judgment: Judgment normal.       Assessment & Plan    1. Vertigo Patient getting intermittent spells of vertigo, likely from earwax buildup.  We will schedule patient for earwax removal in next few days.  May take meclizine as needed and as prescribed.  2. Excessive cerumen in both ear canals We will schedule patient for bilateral ear wash for next few days.  Reassess following procedure.  3. Essential hypertension Generally well controlled.  Patient does monitor blood pressure at home.  Generally runs  130/80 or better.  4. High cholesterol Continue cholesterol-lowering medications as prescribed.   Problem List Items Addressed This Visit       Cardiovascular and Mediastinum   Essential hypertension     Nervous and Auditory   Excessive cerumen in both ear canals     Other   High cholesterol (Chronic)   Vertigo - Primary     Return in about 2 weeks (around 12/15/2021) for dizziness. can we set him up for nurse appointment in next few days to have earwash, especially left.         13/07/2021, NP  [facility] [phone] (phone) [phone] (fax)  [facility]

## 2021-12-03 ENCOUNTER — Other Ambulatory Visit: Payer: Self-pay | Admitting: Nurse Practitioner

## 2021-12-03 ENCOUNTER — Ambulatory Visit (INDEPENDENT_AMBULATORY_CARE_PROVIDER_SITE_OTHER): Payer: Medicare Other | Admitting: Nurse Practitioner

## 2021-12-03 VITALS — BP 141/75 | HR 75 | Ht 71.0 in | Wt 180.0 lb

## 2021-12-03 DIAGNOSIS — R42 Dizziness and giddiness: Secondary | ICD-10-CM

## 2021-12-03 DIAGNOSIS — I10 Essential (primary) hypertension: Secondary | ICD-10-CM

## 2021-12-03 DIAGNOSIS — H6123 Impacted cerumen, bilateral: Secondary | ICD-10-CM

## 2021-12-03 MED ORDER — MECLIZINE HCL 25 MG PO TABS: 25.0000 mg | ORAL_TABLET | Freq: Three times a day (TID) | ORAL | 1 refills | Status: DC

## 2021-12-03 MED ORDER — AMLODIPINE BESYLATE 5 MG PO TABS: 5.0000 mg | ORAL_TABLET | Freq: Every day | ORAL | 1 refills | Status: DC

## 2021-12-03 NOTE — Progress Notes (Signed)
Renewed prescriptions for amlodipine and meclizine and sent to [facility]

## 2021-12-03 NOTE — Progress Notes (Signed)
Pt is here for ear wax build up.  -Bilateral ear lavage performed during today's visit.  -patient tolerated this well.  -reports improvement of symptoms after ear lavage.

## 2021-12-16 DIAGNOSIS — H6123 Impacted cerumen, bilateral: Secondary | ICD-10-CM | POA: Insufficient documentation

## 2021-12-30 ENCOUNTER — Encounter: Payer: Self-pay | Admitting: Nurse Practitioner

## 2021-12-30 ENCOUNTER — Ambulatory Visit (INDEPENDENT_AMBULATORY_CARE_PROVIDER_SITE_OTHER): Payer: Medicare Other | Admitting: Nurse Practitioner

## 2021-12-30 VITALS — BP 158/85 | HR 69 | Resp 18 | Ht 71.0 in | Wt 175.0 lb

## 2021-12-30 DIAGNOSIS — I10 Essential (primary) hypertension: Secondary | ICD-10-CM

## 2021-12-30 DIAGNOSIS — H6123 Impacted cerumen, bilateral: Secondary | ICD-10-CM | POA: Diagnosis not present

## 2021-12-30 DIAGNOSIS — R42 Dizziness and giddiness: Secondary | ICD-10-CM | POA: Diagnosis not present

## 2021-12-30 DIAGNOSIS — Z23 Encounter for immunization: Secondary | ICD-10-CM

## 2021-12-30 NOTE — Progress Notes (Signed)
Established patient visit   Patient: William PABLO Sr.   DOB: Dec 05, 1950   71 y.o. Male  MRN: [id number] Visit Date: 12/30/2021   Chief Complaint  Patient presents with   Follow-up   Dizziness        Subjective    HPI Follow up dizziness  -had ear lavage on 12/03/2021.  -states since that day, he has had to use the meclizine only twice.  -states he keeps waiting to feel dizzy, but has not had any symptoms since ear lavage.  -blood pressure elevated upon arrival. States that it generally is.  --typically comes down by end of visit.  -he would like to get his flu shot today  -denies chest pain, chest pressure, or shortness of breath. He denies headaches or visual disturbances. He denies abdominal pain, nausea, vomiting, or changes in bowel or bladder habits.    HPI     Dizziness    Additional comments:        Last edited by Tonny Bollman, CMA on 12/30/2021  2:53 PM.        Medications: Outpatient Medications Prior to Visit  Medication Sig   amLODipine (NORVASC) 5 MG tablet Take 1 tablet (5 mg total) by mouth daily.   atorvastatin (LIPITOR) 80 MG tablet Take 1 tablet (80 mg total) by mouth daily.   bisoprolol (ZEBETA) 5 MG tablet TAKE 1 TABLET BY MOUTH DAILY.   lisinopril (ZESTRIL) 40 MG tablet TAKE 1 TABLET BY MOUTH DAILY.   meclizine (ANTIVERT) 25 MG tablet Take 1 tablet (25 mg total) by mouth 3 (three) times daily.   No facility-administered medications prior to visit.    Review of Systems  Constitutional:  Negative for activity change, chills, fatigue and fever.  HENT:  Negative for congestion, ear pain, hearing loss, postnasal drip, rhinorrhea, sinus pressure, sinus pain, sneezing and sore throat.        Ear pain has resolved. Hearing loss in left ear has improved.   Eyes: Negative.   Respiratory:  Negative for cough, shortness of breath and wheezing.   Cardiovascular:  Negative for chest pain and palpitations.       Elevated blood pressure    Gastrointestinal:  Negative for constipation, diarrhea, nausea and vomiting.  Endocrine: Negative for cold intolerance, heat intolerance, polydipsia and polyuria.  Genitourinary:  Negative for dysuria, frequency and urgency.  Musculoskeletal:  Negative for back pain and myalgias.  Skin:  Negative for rash.  Allergic/Immunologic: Negative for environmental allergies.  Neurological:  Positive for dizziness. Negative for weakness and headaches.       Resolved dizziness since his ear lavage.Marland Kitchen  Psychiatric/Behavioral:  The patient is not nervous/anxious.        Objective     Today's Vitals   12/30/21 1453 12/30/21 1545  BP: (Abnormal) 163/87 (Abnormal) 158/85  Pulse: 69 69  Resp: 18   SpO2: 98%   Weight: 175 lb (79.4 kg)   Height: 5\' 11"  (1.803 m)    Body mass index is 24.41 kg/m.  BP Readings from Last 3 Encounters:  12/30/21 (Abnormal) 158/85  12/03/21 (Abnormal) 141/75  12/01/21 (Abnormal) 157/77    Wt Readings from Last 3 Encounters:  12/30/21 175 lb (79.4 kg)  12/03/21 180 lb (81.6 kg)  12/01/21 176 lb 8 oz (80.1 kg)    Physical Exam Vitals and nursing note reviewed.  Constitutional:      Appearance: Normal appearance. He is well-developed.  HENT:     Head: Normocephalic and atraumatic.  Right Ear: Tympanic membrane, ear canal and external ear normal.     Left Ear: Tympanic membrane, ear canal and external ear normal.     Ears:     Comments: Mild to moderate ear wax present in left inner ear canal.     Nose: Nose normal.     Mouth/Throat:     Mouth: Mucous membranes are moist.     Pharynx: Oropharynx is clear.  Eyes:     Extraocular Movements: Extraocular movements intact.     Conjunctiva/sclera: Conjunctivae normal.     Pupils: Pupils are equal, round, and reactive to light.  Neck:     Vascular: No carotid bruit.  Cardiovascular:     Rate and Rhythm: Normal rate and regular rhythm.     Pulses: Normal pulses.     Heart sounds: Normal heart sounds.   Pulmonary:     Effort: Pulmonary effort is normal.     Breath sounds: Normal breath sounds.  Abdominal:     Palpations: Abdomen is soft.  Musculoskeletal:        General: Normal range of motion.     Cervical back: Normal range of motion and neck supple.  Lymphadenopathy:     Cervical: No cervical adenopathy.  Skin:    General: Skin is warm and dry.     Capillary Refill: Capillary refill takes less than 2 seconds.  Neurological:     General: No focal deficit present.     Mental Status: He is alert and oriented to person, place, and time.  Psychiatric:        Mood and Affect: Mood normal.        Behavior: Behavior normal.        Thought Content: Thought content normal.        Judgment: Judgment normal.       Assessment & Plan    1. Vertigo Nearly resolved. Patient has had to take meclizine only twice since ear lavage. He will keep prescription to take on as needed basis. Will contact the office if symptoms return and worsen   2. Excessive cerumen in both ear canals Improved. Will reassess in 4 months and as needed if prior symptoms return.   3. Essential hypertension Generally stable. No changes to medication. Patient does monitor blood pressure at home and will contact the office if it remains elevated.   4. Need for influenza vaccination Flu vaccine administered during today's visit.  - Flu Vaccine QUAD High Dose(Fluad)   Problem List Items Addressed This Visit       Cardiovascular and Mediastinum   Essential hypertension     Nervous and Auditory   Excessive cerumen in both ear canals     Other   Vertigo - Primary   Other Visit Diagnoses     Need for influenza vaccination       Relevant Orders   Flu Vaccine QUAD High Dose(Fluad) (Completed)        Return in about 4 months (around 04/30/2022) for blood pressure.         Carlean Jews, NP  [facility] at [facility] [phone] (phone) [phone] (fax)  [facility]

## 2022-03-23 ENCOUNTER — Telehealth: Payer: Self-pay | Admitting: *Deleted

## 2022-03-23 NOTE — Telephone Encounter (Signed)
Pt called and appointment was rescheduled.William Hogan, CMA

## 2022-03-24 ENCOUNTER — Other Ambulatory Visit: Payer: Self-pay | Admitting: Nurse Practitioner

## 2022-03-24 DIAGNOSIS — R42 Dizziness and giddiness: Secondary | ICD-10-CM

## 2022-03-24 NOTE — Telephone Encounter (Signed)
L.O.V: 12/30/21  N.O.V: 05/04/22  L.R.F: 12/03/21 Meclizine 30 tab 1 refill   Refill sent

## 2022-04-08 ENCOUNTER — Other Ambulatory Visit: Payer: Self-pay | Admitting: Nurse Practitioner

## 2022-04-30 ENCOUNTER — Ambulatory Visit: Payer: Medicare Other | Admitting: Nurse Practitioner

## 2022-05-04 ENCOUNTER — Encounter: Payer: Self-pay | Admitting: Nurse Practitioner

## 2022-05-04 ENCOUNTER — Ambulatory Visit (INDEPENDENT_AMBULATORY_CARE_PROVIDER_SITE_OTHER): Payer: Medicare Other | Admitting: Nurse Practitioner

## 2022-05-04 VITALS — BP 156/76 | HR 81 | Ht 71.0 in | Wt 179.1 lb

## 2022-05-04 DIAGNOSIS — H8190 Unspecified disorder of vestibular function, unspecified ear: Secondary | ICD-10-CM

## 2022-05-04 DIAGNOSIS — E7800 Pure hypercholesterolemia, unspecified: Secondary | ICD-10-CM | POA: Diagnosis not present

## 2022-05-04 DIAGNOSIS — I10 Essential (primary) hypertension: Secondary | ICD-10-CM

## 2022-05-04 DIAGNOSIS — Z8669 Personal history of other diseases of the nervous system and sense organs: Secondary | ICD-10-CM | POA: Diagnosis not present

## 2022-05-04 NOTE — Assessment & Plan Note (Signed)
Stable.  Continue current medication

## 2022-05-04 NOTE — Assessment & Plan Note (Signed)
Currently resolved. Possibly due to cerumen build up in ear canals. Will monitor

## 2022-05-04 NOTE — Progress Notes (Signed)
Established patient visit   Patient: William BIDDICK Sr.   DOB: [birth year]   72 y.o. Male  MRN: [id number] Visit Date: 05/04/2022   Chief Complaint  Patient presents with   Medical Management of Chronic Issues   Subjective    HPI  Follow up  -hypertension  --generally elevated during visit.  Sometimes  normalizes by end of viisitl  -intermittent vertigo -history of ear wax build up causing him to lose balance.  -last visit here, got ears cleaned out. States that he has not had any problems with vertigo since then.  -has taken meclizine periodically, if he thought he was going to have a dizzy spell.  -states that he does not need a refill for this today. -He denies chest pain, chest pressure, or shortness of breath. He denies headaches or visual disturbances. He denies abdominal pain, nausea, vomiting, or changes in bowel or bladder habits.    Medications: Outpatient Medications Prior to Visit  Medication Sig   amLODipine (NORVASC) 5 MG tablet Take 1 tablet (5 mg total) by mouth daily.   atorvastatin (LIPITOR) 80 MG tablet TAKE 1 TABLET BY MOUTH DAILY   bisoprolol (ZEBETA) 5 MG tablet TAKE 1 TABLET BY MOUTH DAILY.   lisinopril (ZESTRIL) 40 MG tablet TAKE 1 TABLET BY MOUTH DAILY.   meclizine (ANTIVERT) 25 MG tablet TAKE 1 TABLET BY MOUTH 3 TIMES DAILY.   No facility-administered medications prior to visit.    Review of Systems See HPI      Objective     Today's Vitals   05/04/22 1431  BP: (Abnormal) 156/76  Pulse: 81  SpO2: 96%  Weight: 179 lb 1.9 oz (81.2 kg)  Height: 5\' 11"  (1.803 m)   Body mass index is 24.98 kg/m.  BP Readings from Last 3 Encounters:  05/04/22 (Abnormal) 156/76  12/30/21 (Abnormal) 158/85  12/03/21 (Abnormal) 141/75    Wt Readings from Last 3 Encounters:  05/04/22 179 lb 1.9 oz (81.2 kg)  12/30/21 175 lb (79.4 kg)  12/03/21 180 lb (81.6 kg)    Physical Exam Vitals and nursing note reviewed.  Constitutional:      Appearance: Normal  appearance. He is well-developed.  HENT:     Head: Normocephalic and atraumatic.     Right Ear: Tympanic membrane, ear canal and external ear normal.     Left Ear: Tympanic membrane, ear canal and external ear normal.     Ears:     Comments: Minimal wax in bilateral external ear canals.     Nose: Nose normal.     Mouth/Throat:     Mouth: Mucous membranes are moist.     Pharynx: Oropharynx is clear.  Eyes:     Extraocular Movements: Extraocular movements intact.     Conjunctiva/sclera: Conjunctivae normal.     Pupils: Pupils are equal, round, and reactive to light.  Cardiovascular:     Rate and Rhythm: Normal rate and regular rhythm.     Pulses: Normal pulses.     Heart sounds: Normal heart sounds.  Pulmonary:     Effort: Pulmonary effort is normal.     Breath sounds: Wheezing present.  Abdominal:     Palpations: Abdomen is soft.  Musculoskeletal:        General: Normal range of motion.     Cervical back: Normal range of motion and neck supple.  Lymphadenopathy:     Cervical: No cervical adenopathy.  Skin:    General: Skin is warm and dry.  Capillary Refill: Capillary refill takes less than 2 seconds.  Neurological:     General: No focal deficit present.     Mental Status: He is alert and oriented to person, place, and time. Mental status is at baseline.  Psychiatric:        Mood and Affect: Mood normal.        Behavior: Behavior normal.        Thought Content: Thought content normal.        Judgment: Judgment normal.      Assessment & Plan    Essential hypertension Assessment & Plan: Stable. Continue current medication.    Episodic recurrent vertigo Assessment & Plan: Currently resolved. Possibly due to cerumen build up in ear canals. Will monitor    H/O impacted cerumen Assessment & Plan: Ear lavage performed at most recent visit. Ear canals with minimal wax today. Will monitor.    High cholesterol Assessment & Plan: Stable. Continue current  medication.       Return in about 4 months (around 09/03/2022) for blood pressure, vertigo - will need mWV in nextt available slot after  that.         Ronnell Freshwater, NP  [facility] at [facility] [phone] (phone) [phone] (fax)  [place]

## 2022-05-04 NOTE — Assessment & Plan Note (Signed)
>>  ASSESSMENT AND PLAN FOR HIGH CHOLESTEROL WRITTEN ON 05/04/2022  2:54 PM BY BOSCIA, HEATHER E, NP  Stable. Continue current medication.

## 2022-05-04 NOTE — Assessment & Plan Note (Signed)
Ear lavage performed at most recent visit. Ear canals with minimal wax today. Will monitor.

## 2022-05-29 ENCOUNTER — Other Ambulatory Visit: Payer: Self-pay | Admitting: Nurse Practitioner

## 2022-05-29 DIAGNOSIS — R42 Dizziness and giddiness: Secondary | ICD-10-CM

## 2022-05-29 DIAGNOSIS — I10 Essential (primary) hypertension: Secondary | ICD-10-CM

## 2022-06-01 DIAGNOSIS — R6889 Other general symptoms and signs: Secondary | ICD-10-CM | POA: Diagnosis not present

## 2022-06-01 DIAGNOSIS — R079 Chest pain, unspecified: Secondary | ICD-10-CM | POA: Diagnosis not present

## 2022-06-01 DIAGNOSIS — R0789 Other chest pain: Secondary | ICD-10-CM | POA: Diagnosis not present

## 2022-06-01 DIAGNOSIS — Z743 Need for continuous supervision: Secondary | ICD-10-CM | POA: Diagnosis not present

## 2022-06-01 DIAGNOSIS — J432 Centrilobular emphysema: Secondary | ICD-10-CM | POA: Diagnosis not present

## 2022-06-01 DIAGNOSIS — R911 Solitary pulmonary nodule: Secondary | ICD-10-CM | POA: Diagnosis not present

## 2022-06-18 ENCOUNTER — Encounter: Payer: Self-pay | Admitting: Family Medicine

## 2022-06-18 ENCOUNTER — Ambulatory Visit (INDEPENDENT_AMBULATORY_CARE_PROVIDER_SITE_OTHER): Payer: Medicare Other | Admitting: Family Medicine

## 2022-06-18 VITALS — BP 150/76 | HR 80 | Resp 18 | Ht 71.0 in | Wt 180.0 lb

## 2022-06-18 DIAGNOSIS — I10 Essential (primary) hypertension: Secondary | ICD-10-CM | POA: Diagnosis not present

## 2022-06-18 DIAGNOSIS — Z09 Encounter for follow-up examination after completed treatment for conditions other than malignant neoplasm: Secondary | ICD-10-CM

## 2022-06-18 MED ORDER — AMLODIPINE BESYLATE 10 MG PO TABS: 10.0000 mg | ORAL_TABLET | Freq: Every day | ORAL | 11 refills | Status: DC

## 2022-06-18 NOTE — Progress Notes (Signed)
Established Patient Office Visit  Subjective   Patient ID: William Hogan., male    DOB: 1950/09/02  Age: 72 y.o. MRN: [id number]  Chief Complaint  Patient presents with   Follow-up         HPI William ANDRESKI Sr. is a 72 y.o. male presenting today for follow up of emergency room visit for chest pain on 06/01/2022.  Workup reassuring to rule out myocardial infarction.  EKG, chest x-ray and CT chest without remarkable findings for acute processes.  Pulmonary nodule appeared stable.  Since discharge from emergency room, patient denies subsequent episodes of chest pain.  Denies peripheral edema, shortness of breath/dyspnea on exertion.  He has continued his lisinopril, bisoprolol, and amlodipine.  Per ED provider's note: "CBC demonstrated mild leukocytosis at 11, do not think this is systemic infection at this time given patient does not have other systemic symptoms such as fever, cough, tachycardia or hypoxia. CMP was notable for hyponatremia but when corrected for patient's hyperglycemia is within normal limits, and no signs of AKI. Serial troponins are negative. BNP slightly elevated. ECG reassuring for no acute ischemic changes. Chest x-ray negative for acute cardiopulmonary process but notes possible change in pulmonary nodule. 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    Appearance: Normal appearance.  HENT:     Head: Normocephalic and atraumatic.  Cardiovascular:     Rate and Rhythm: Normal rate and regular rhythm.     Pulses: Normal pulses.      Heart sounds: Normal heart sounds. No murmur heard.    No friction rub. No gallop.  Pulmonary:     Effort: Pulmonary effort is normal. No respiratory distress.     Breath sounds: Normal breath sounds. No wheezing, rhonchi or rales.  Skin:    General: Skin is warm and dry.  Neurological:     Mental Status: He is alert and oriented to person, place, and time.  Psychiatric:        Mood and Affect: Mood normal.     Assessment & Plan:  Hospital discharge follow-up  Essential hypertension Assessment & Plan: Blood pressure elevated 182/79 on presentation, decreased to 150/76 on repeat but still above goal <130/80.  Continue lisinopril 40 mg daily, bisoprolol 5 mg daily.  Increase amlodipine to 10 mg daily.  Return for nurse visit in 2 weeks to ensure that blood pressure has decreased.  Patient is also under increased stress due to financial situation with his wife being in long-term home after having a stroke which can be contributing to elevated blood pressures.  Orders: -     amLODIPine Besylate; Take 1 tablet (10 mg total) by mouth daily.  Dispense: 30 tablet; Refill: 11  We discussed that patient should be taking lisinopril, bisoprolol as well as amlodipine.  His after visit summary from the hospital mistakenly did not list amlodipine as one of his current medications and he needed clarification.  Return  for previously scheduled follow up appt in July.  AWV can be completed over the phone.   [name], PA

## 2022-06-18 NOTE — Assessment & Plan Note (Addendum)
Blood pressure elevated 182/79 on presentation, decreased to 150/76 on repeat but still above goal <130/80.  Continue lisinopril 40 mg daily, bisoprolol 5 mg daily.  Increase amlodipine to 10 mg daily.  Return for nurse visit in 2 weeks to ensure that blood pressure has decreased.  Patient is also under increased stress due to financial situation with his wife being in long-term home after having a stroke which can be contributing to elevated blood pressures.  After getting better control of blood pressure, may consider checking BNP or sending for echo given slight elevation in the ED.

## 2022-06-24 ENCOUNTER — Ambulatory Visit: Payer: Medicare Other | Admitting: Family Medicine

## 2022-06-26 ENCOUNTER — Other Ambulatory Visit: Payer: Self-pay | Admitting: Nurse Practitioner

## 2022-06-26 DIAGNOSIS — I10 Essential (primary) hypertension: Secondary | ICD-10-CM

## 2022-06-29 ENCOUNTER — Other Ambulatory Visit: Payer: Self-pay | Admitting: Nurse Practitioner

## 2022-06-29 DIAGNOSIS — I10 Essential (primary) hypertension: Secondary | ICD-10-CM

## 2022-07-02 ENCOUNTER — Ambulatory Visit (INDEPENDENT_AMBULATORY_CARE_PROVIDER_SITE_OTHER): Payer: Medicare Other | Admitting: Nurse Practitioner

## 2022-07-02 DIAGNOSIS — I10 Essential (primary) hypertension: Secondary | ICD-10-CM

## 2022-07-02 NOTE — Progress Notes (Signed)
Pt is here for a recheck of his blood pressure

## 2022-08-17 ENCOUNTER — Encounter: Payer: Medicare Other | Admitting: Nurse Practitioner

## 2022-09-03 ENCOUNTER — Ambulatory Visit: Payer: Medicare Other | Admitting: Family Medicine

## 2022-09-03 ENCOUNTER — Ambulatory Visit: Payer: Medicare Other | Admitting: Nurse Practitioner

## 2022-10-26 ENCOUNTER — Encounter: Payer: Self-pay | Admitting: Family Medicine

## 2022-10-26 ENCOUNTER — Ambulatory Visit (INDEPENDENT_AMBULATORY_CARE_PROVIDER_SITE_OTHER): Payer: Medicare Other | Admitting: Family Medicine

## 2022-10-26 VITALS — BP 147/85 | HR 76 | Resp 18 | Ht 71.0 in | Wt 180.0 lb

## 2022-10-26 DIAGNOSIS — H8190 Unspecified disorder of vestibular function, unspecified ear: Secondary | ICD-10-CM | POA: Diagnosis not present

## 2022-10-26 DIAGNOSIS — I10 Essential (primary) hypertension: Secondary | ICD-10-CM | POA: Diagnosis not present

## 2022-10-26 DIAGNOSIS — Z1159 Encounter for screening for other viral diseases: Secondary | ICD-10-CM | POA: Diagnosis not present

## 2022-10-26 DIAGNOSIS — E782 Mixed hyperlipidemia: Secondary | ICD-10-CM | POA: Diagnosis not present

## 2022-10-26 MED ORDER — LISINOPRIL 40 MG PO TABS: 40.0000 mg | ORAL_TABLET | Freq: Every day | ORAL | 1 refills | Status: DC

## 2022-10-26 MED ORDER — BISOPROLOL-HYDROCHLOROTHIAZIDE 5-6.25 MG PO TABS
1.0000 | ORAL_TABLET | Freq: Every day | ORAL | 1 refills | Status: DC
Start: 2022-10-26 — End: 2022-11-04

## 2022-10-26 NOTE — Assessment & Plan Note (Signed)
Lipid panel has not been checked since 08/15/2021.  Last lipid panel: LDL 76, HDL 47, triglycerides 74.  Continue atorvastatin 80 mg daily unless repeat lipid panel warrants change.  Will continue to monitor.

## 2022-10-26 NOTE — Patient Instructions (Addendum)
I am going to change your blood pressure medicine slightly.  You will still take your lisinopril and your amlodipine the same happen.  I am changing your bisoprolol to a combination pill with bisoprolol and hydrochlorothiazide.  We will recheck your blood pressure and your labs in about 2 weeks to see if this gets you to your goal.  I am also happy to continue to work with you on getting all of your care updated while still giving you the freedom to care for your wife!  We will work on getting copies of your most recent vaccines.  The ones that our system does not have updated are: -Tetanus booster (Tdap) -Pneumonia PCV20 -Shingles  -Most recent flu  At some point, we will also need to update your screening procedures: -lung scan -colon cancer screening (colonoscopy or cologuard)

## 2022-10-26 NOTE — Progress Notes (Signed)
Established Patient Office Visit  Subjective   Patient ID: William Goudreau., male    DOB: [birth year]  Age: 72 y.o. MRN: [id number]  Chief Complaint  Patient presents with   Hypertension    HPI William SZCZEPANIAK Sr. is a 72 y.o. male presenting today for follow up of hypertension, hyperlipidemia, vertigo. Hypertension: Patient here for follow-up of elevated blood pressure. Pt denies chest pain, SOB, dizziness, edema, syncope, fatigue or heart palpitations. Taking bisoprolol, amlodipine, lisinopril, reports excellent compliance with treatment. Denies side effects.  Due to elevated blood pressures, dose of amlodipine was increased to 10 mg daily at last visit.  He has continued to have blood pressures consistently above goal, ambulatory blood pressure tends to be around 148/78. Hyperlipidemia: tolerating atorvastatin well with no myalgias or significant side effects.  It will be important to maintain and improve blood pressure control to lower ASCVD risk score. The 10-year ASCVD risk score (Arnett DK, et al., 2019) is: 25.9% Vertigo: Resolved as of 05/04/2022, previous episodes possibly due to cerumen impaction in ears.  Outpatient Medications Prior to Visit  Medication Sig   amLODipine (NORVASC) 10 MG tablet Take 1 tablet (10 mg total) by mouth daily.   atorvastatin (LIPITOR) 80 MG tablet TAKE 1 TABLET BY MOUTH DAILY   meclizine (ANTIVERT) 25 MG tablet TAKE 1 TABLET BY MOUTH 3 TIMES DAILY.   [DISCONTINUED] bisoprolol (ZEBETA) 5 MG tablet TAKE 1 TABLET BY MOUTH DAILY   [DISCONTINUED] lisinopril (ZESTRIL) 40 MG tablet TAKE 1 TABLET BY MOUTH DAILY   No facility-administered medications prior to visit.    ROS Negative unless otherwise noted in HPI   Objective:     BP (!) 147/85 (BP Location: Left Arm, Patient Position: Sitting, Cuff Size: Normal)   Pulse 76   Resp 18   Ht 5\' 11"  (1.803 m)   Wt 180 lb (81.6 kg)   SpO2 95%   BMI 25.10 kg/m   Physical Exam Constitutional:       General: He is not in acute distress.    Appearance: Normal appearance.  HENT:     Head: Normocephalic and atraumatic.     Right Ear: Tympanic membrane, ear canal and external ear normal. There is no impacted cerumen.     Left Ear: Tympanic membrane, ear canal and external ear normal. There is no impacted cerumen.  Cardiovascular:     Rate and Rhythm: Normal rate and regular rhythm.     Heart sounds: Normal heart sounds. No murmur heard.    No friction rub. No gallop.  Pulmonary:     Effort: Pulmonary effort is normal. No respiratory distress.     Breath sounds: Normal breath sounds. No wheezing, rhonchi or rales.  Skin:    General: Skin is warm and dry.  Neurological:     Mental Status: He is alert and oriented to person, place, and time.  Psychiatric:        Mood and Affect: Mood normal.     Assessment & Plan:  Essential hypertension Assessment & Plan: BP goal <130/80.  Blood pressure above goal.  Continue lisinopril 40 mg daily, amlodipine 10 mg daily.  Add low-dose of hydrochlorothiazide in combination pill: Start bisoprolol-hydrochlorothiazide 5-6.25 mg daily.  Recheck blood pressure and CMP in 2 weeks.  Will continue to monitor  Orders: -     CBC with Differential/Platelet; Future -     Comprehensive metabolic panel; Future -     Hemoglobin A1c; Future -  Lisinopril; Take 1 tablet (40 mg total) by mouth daily.  Dispense: 90 tablet; Refill: 1 -     Bisoprolol-hydroCHLOROthiazide; Take 1 tablet by mouth daily.  Dispense: 90 tablet; Refill: 1  Mixed hyperlipidemia Assessment & Plan: Lipid panel has not been checked since 08/15/2021.  Last lipid panel: LDL 76, HDL 47, triglycerides 74.  Continue atorvastatin 80 mg daily unless repeat lipid panel warrants change.  Will continue to monitor.  Orders: -     CBC with Differential/Platelet; Future -     Comprehensive metabolic panel; Future -     Lipid panel; Future  Episodic recurrent vertigo Assessment & Plan: No further  episodes since last appointment.  Previously thought possibly due to cerumen build up in ear canals.  Ears clear on exam today.  Will continue to monitor.   Screening for viral disease -     Hepatitis C antibody; Future  Discussed overdue preventative care including flu vaccine, PCV 20 vaccine, shingles vaccines, tetanus vaccines.  He believes that he had several of these updated at [facility].  They are not available in [facility], so we will request copies from [facility] directly.  He is also aware that he is due for colorectal cancer screening and lung cancer screening.  His time is limited as he visits his wife about 4 times a week, she is still recovering from a stroke.  He is working on making time for his own care.  Return in about 4 months (around 02/25/2023) for annual physical, fasting blood work 1 week before.    [name], PA

## 2022-10-26 NOTE — Assessment & Plan Note (Signed)
No further episodes since last appointment.  Previously thought possibly due to cerumen build up in ear canals.  Ears clear on exam today.  Will continue to monitor.

## 2022-10-26 NOTE — Assessment & Plan Note (Addendum)
BP goal <130/80.  Blood pressure above goal.  Continue lisinopril 40 mg daily, amlodipine 10 mg daily.  Add low-dose of hydrochlorothiazide in combination pill: Start bisoprolol-hydrochlorothiazide 5-6.25 mg daily.  Recheck blood pressure and CMP in 2 weeks.  Will continue to monitor

## 2022-11-04 ENCOUNTER — Telehealth: Payer: Self-pay

## 2022-11-04 DIAGNOSIS — I10 Essential (primary) hypertension: Secondary | ICD-10-CM

## 2022-11-04 MED ORDER — BISOPROLOL FUMARATE 10 MG PO TABS
10.0000 mg | ORAL_TABLET | Freq: Every day | ORAL | 1 refills | Status: DC
Start: 2022-11-04 — End: 2023-02-25

## 2022-11-04 NOTE — Telephone Encounter (Signed)
He can stop the combination bisoprolol-hydrochlorothiazide.  I am sending a higher dose of the bisoprolol at 10 mg once a day so that we can get his blood pressure under control.  He should also continue amlodipine 10 mg once a day and lisinopril 40 mg once a day.

## 2022-11-04 NOTE — Telephone Encounter (Signed)
Pt states that the new medication that he started bisoprolol-hydrochlorothiazide (ZIAC) 5-6.25 MG tablet has him feeling like he is in the twilight zone. Pt states that he is off balance.   Started medication on 10/27/22 and stopped it yesterday or Monday he couldn't remember.   Pt is asking should he start the old medication back or will provider try a different medication.

## 2022-11-04 NOTE — Telephone Encounter (Signed)
Patient was informed of provider's instructions. Patient states he still has plenty of the Bisoprolol 5 mg. I informed patient that he can take 2 tablets of the 5 mg until he runs out and then continue with the 10 mg. Patient verbalized understanding. All questions and concerns have been addressed.

## 2022-11-09 ENCOUNTER — Ambulatory Visit (INDEPENDENT_AMBULATORY_CARE_PROVIDER_SITE_OTHER): Payer: Medicare Other | Admitting: Family Medicine

## 2022-11-09 DIAGNOSIS — Z1159 Encounter for screening for other viral diseases: Secondary | ICD-10-CM

## 2022-11-09 DIAGNOSIS — R7301 Impaired fasting glucose: Secondary | ICD-10-CM | POA: Diagnosis not present

## 2022-11-09 DIAGNOSIS — I10 Essential (primary) hypertension: Secondary | ICD-10-CM | POA: Diagnosis not present

## 2022-11-09 DIAGNOSIS — E782 Mixed hyperlipidemia: Secondary | ICD-10-CM | POA: Diagnosis not present

## 2022-11-09 NOTE — Progress Notes (Signed)
Pt denies CP, SOB, dizziness, or heart palpitations. taking meds as directed without problems. Denies med side effects. 5 min spent with pt.    Patient reports home bp readings of 115/67 and 117/74 on 11/08/22.

## 2022-11-10 ENCOUNTER — Ambulatory Visit: Payer: Medicare Other

## 2022-11-10 VITALS — Ht 71.0 in | Wt 180.0 lb

## 2022-11-10 DIAGNOSIS — Z0000 Encounter for general adult medical examination without abnormal findings: Secondary | ICD-10-CM | POA: Diagnosis not present

## 2022-11-10 LAB — LIPID PANEL
Chol/HDL Ratio: 2.5 {ratio} (ref 0.0–5.0)
Cholesterol, Total: 130 mg/dL (ref 100–199)
HDL: 51 mg/dL (ref >39)
LDL Chol Calc (NIH): 64 mg/dL (ref 0–99)
Triglycerides: 78 mg/dL (ref 0–149)
VLDL Cholesterol Cal: 15 mg/dL (ref 5–40)

## 2022-11-10 LAB — CBC WITH DIFFERENTIAL/PLATELET
Basophils Absolute: 0 10*3/uL (ref 0.0–0.2)
Basos: 0 %
EOS (ABSOLUTE): 0.3 10*3/uL (ref 0.0–0.4)
Eos: 3 %
Hematocrit: 47.3 % (ref 37.5–51.0)
Hemoglobin: 16.1 g/dL (ref 13.0–17.7)
Immature Grans (Abs): 0 10*3/uL (ref 0.0–0.1)
Immature Granulocytes: 0 %
Lymphocytes Absolute: 4.5 10*3/uL — ABNORMAL HIGH (ref 0.7–3.1)
Lymphs: 38 %
MCH: 29.5 pg (ref 26.6–33.0)
MCHC: 34 g/dL (ref 31.5–35.7)
MCV: 87 fL (ref 79–97)
Monocytes Absolute: 0.7 10*3/uL (ref 0.1–0.9)
Monocytes: 6 %
Neutrophils Absolute: 6.4 10*3/uL (ref 1.4–7.0)
Neutrophils: 53 %
Platelets: 317 10*3/uL (ref 150–450)
RBC: 5.46 x10E6/uL (ref 4.14–5.80)
RDW: 12.8 % (ref 11.6–15.4)
WBC: 12 10*3/uL — ABNORMAL HIGH (ref 3.4–10.8)

## 2022-11-10 LAB — COMPREHENSIVE METABOLIC PANEL
ALT: 36 [IU]/L (ref 0–44)
AST: 36 [IU]/L (ref 0–40)
Albumin: 4.3 g/dL (ref 3.8–4.8)
Alkaline Phosphatase: 99 [IU]/L (ref 44–121)
BUN/Creatinine Ratio: 14 (ref 10–24)
BUN: 15 mg/dL (ref 8–27)
Bilirubin Total: 0.9 mg/dL (ref 0.0–1.2)
CO2: 23 mmol/L (ref 20–29)
Calcium: 9.6 mg/dL (ref 8.6–10.2)
Chloride: 104 mmol/L (ref 96–106)
Creatinine, Ser: 1.1 mg/dL (ref 0.76–1.27)
Globulin, Total: 2.8 g/dL (ref 1.5–4.5)
Glucose: 100 mg/dL — ABNORMAL HIGH (ref 70–99)
Potassium: 4.1 mmol/L (ref 3.5–5.2)
Sodium: 144 mmol/L (ref 134–144)
Total Protein: 7.1 g/dL (ref 6.0–8.5)
eGFR: 71 mL/min/{1.73_m2} (ref >59)

## 2022-11-10 LAB — HEMOGLOBIN A1C
Est. average glucose Bld gHb Est-mCnc: 117 mg/dL
Hgb A1c MFr Bld: 5.7 % — ABNORMAL HIGH (ref 4.8–5.6)

## 2022-11-10 LAB — HEPATITIS C ANTIBODY: Hep C Virus Ab: NONREACTIVE

## 2022-11-10 NOTE — Progress Notes (Signed)
Subjective:   William CARLYON Sr. is a 72 y.o. male who presents for Medicare Annual/Subsequent preventive examination.  Visit Complete: Virtual  I connected with  William Mouton Sr. on 11/10/22 by a audio enabled telemedicine application and verified that I am speaking with the correct person using two identifiers.  Patient Location: Home  Provider Location: Home Office  I discussed the limitations of evaluation and management by telemedicine. The patient expressed understanding and agreed to proceed.   Because this visit was a virtual/telehealth visit, some criteria may be missing or patient reported. Any vitals not documented were not able to be obtained and vitals that have been documented are patient reported.    Cardiac Risk Factors include: advanced age (>10men, >24 women);male gender;hypertension     Objective:    Today's Vitals   11/10/22 1325 11/10/22 1326  Weight: 180 lb (81.6 kg)   Height: 5\' 11"  (1.803 m)   PainSc:  0-No pain   Body mass index is 25.1 kg/m.     11/10/2022    1:33 PM 10/22/2020    2:31 PM 09/29/2017    3:25 PM 03/12/2017    3:49 PM 09/06/2016   12:48 AM 09/05/2016    3:35 PM  Advanced Directives  Does Patient Have a Medical Advance Directive? Yes Yes No No No No  Type of Estate agent of [place];Living will       Copy of Healthcare Power of Attorney in Chart? No - copy requested       Would patient like information on creating a medical advance directive?   No - Patient declined No - Patient declined No - Patient declined     Current Medications (verified) Outpatient Encounter Medications as of 11/10/2022  Medication Sig   amLODipine (NORVASC) 10 MG tablet Take 1 tablet (10 mg total) by mouth daily.   atorvastatin (LIPITOR) 80 MG tablet TAKE 1 TABLET BY MOUTH DAILY   bisoprolol (ZEBETA) 10 MG tablet Take 1 tablet (10 mg total) by mouth daily.   [START ON 12/25/2022] lisinopril (ZESTRIL) 40 MG tablet Take 1 tablet  (40 mg total) by mouth daily.   meclizine (ANTIVERT) 25 MG tablet TAKE 1 TABLET BY MOUTH 3 TIMES DAILY.   No facility-administered encounter medications on file as of 11/10/2022.    Allergies (verified) Clonidine derivatives   History: Past Medical History:  Diagnosis Date   High cholesterol    Hypertension    History reviewed. No pertinent surgical history. Family History  Problem Relation Age of Onset   Stroke Father    Social History   Socioeconomic History   Marital status: Married    Spouse name: Ann   Number of children: 3   Years of education: Not on file   Highest education level: High school graduate  Occupational History   Not on file  Tobacco Use   Smoking status: Former    Current packs/day: 0.00    Average packs/day: 1.5 packs/day for 40.0 years (60.0 ttl pk-yrs)    Types: Cigarettes    Start date: 09/05/1976    Quit date: 09/05/2016    Years since quitting: 6.1    Passive exposure: Never   Smokeless tobacco: Never  Vaping Use   Vaping status: Never Used  Substance and Sexual Activity   Alcohol use: No    Comment: Quit drinking alcohol 10/20/2010   Drug use: No   Sexual activity: Not Currently  Other Topics Concern   Not on file  Social History Narrative   Not on file   Social Determinants of Health   Financial Resource Strain: Low Risk  (11/10/2022)   Overall Financial Resource Strain (CARDIA)    Difficulty of Paying Living Expenses: Not hard at all  Food Insecurity: No Food Insecurity (11/10/2022)   Hunger Vital Sign    Worried About Running Out of Food in the Last Year: Never true    Ran Out of Food in the Last Year: Never true  Transportation Needs: No Transportation Needs (11/10/2022)   PRAPARE - Administrator, Civil Service (Medical): No    Lack of Transportation (Non-Medical): No  Physical Activity: Inactive (11/10/2022)   Exercise Vital Sign    Days of Exercise per Week: 0 days    Minutes of Exercise per Session: 0 min   Stress: No Stress Concern Present (08/15/2021)   [employer] of Occupational Health - Occupational Stress Questionnaire    Feeling of Stress : Only a little  Social Connections: Moderately Isolated (11/10/2022)   Social Connection and Isolation Panel [NHANES]    Frequency of Communication with Friends and Family: More than three times a week    Frequency of Social Gatherings with Friends and Family: More than three times a week    Attends Religious Services: Never    Database administrator or Organizations: No    Attends Engineer, structural: Never    Marital Status: Married    Tobacco Counseling Counseling given: Not Answered   Clinical Intake:  Pre-visit preparation completed: Yes  Pain : No/denies pain Pain Score: 0-No pain     BMI - recorded: 25.1 Nutritional Status: BMI 25 -29 Overweight Nutritional Risks: None Diabetes: No  How often do you need to have someone help you when you read instructions, pamphlets, or other written materials from your doctor or pharmacy?: 1 - Never  Interpreter Needed?: No  Information entered by :: Theresa Mulligan LPN   Activities of Daily Living    11/10/2022    1:32 PM 12/30/2021    3:02 PM  In your present state of health, do you have any difficulty performing the following activities:  Hearing? 0 1  Vision? 0 0  Difficulty concentrating or making decisions? 0 0  Walking or climbing stairs? 0 0  Dressing or bathing? 0 0  Doing errands, shopping? 0 0  Preparing Food and eating ? N   Using the Toilet? N   In the past six months, have you accidently leaked urine? N   Do you have problems with loss of bowel control? N   Managing your Medications? N   Managing your Finances? N   Housekeeping or managing your Housekeeping? N     Patient Care Team: [name], PA as PCP - General (Family Medicine) Elder Negus, MD as Consulting Physician (Cardiology) Lanelle Bal, MD (Inactive) as Resident  (Internal Medicine) Aida Puffer, MD as Referring Physician (Family Medicine)  Indicate any recent Medical Services you may have received from other than [facility] providers in the past year (date may be approximate).     Assessment:   This is a routine wellness examination for William Hogan.  Hearing/Vision screen Hearing Screening - Comments:: Denies hearing difficulties   Vision Screening - Comments:: Wears rx glasses - up to date with routine eye exams with  [facility]   Goals Addressed               This Visit's Progress  Stay Active (pt-stated)         Depression Screen    11/10/2022    1:32 PM 06/18/2022    4:00 PM 05/04/2022    2:34 PM 12/30/2021    3:01 PM 12/01/2021    2:32 PM 08/15/2021   10:01 AM 07/04/2021   10:39 AM  PHQ 2/9 Scores  PHQ - 2 Score 0 0 0 0 0 0 0  PHQ- 9 Score  0 1 0 1 5 3     Fall Risk    11/10/2022    1:33 PM 05/04/2022    2:33 PM 12/30/2021    3:02 PM 08/15/2021   10:01 AM 10/22/2020    2:31 PM  Fall Risk   Falls in the past year? 0 0 0 0 0  Number falls in past yr: 0 0 0 0 0  Injury with Fall? 0 0 0 0 0  Risk for fall due to : No Fall Risks   No Fall Risks   Follow up Falls prevention discussed Falls evaluation completed  Falls evaluation completed     MEDICARE RISK AT HOME: Medicare Risk at Home Any stairs in or around the home?: No If so, are there any without handrails?: No Home free of loose throw rugs in walkways, pet beds, electrical cords, etc?: Yes Adequate lighting in your home to reduce risk of falls?: Yes Life alert?: No Use of a cane, walker or w/c?: No Grab bars in the bathroom?: Yes Shower chair or bench in shower?: Yes Elevated toilet seat or a handicapped toilet?: No  TIMED UP AND GO:  Was the test performed?  No    Cognitive Function:        11/10/2022    1:34 PM 08/15/2021   10:02 AM 08/24/2018    1:06 PM  6CIT Screen  What Year? 0 points 0 points 0 points  What month? 0 points 0 points 0 points  What  time? 0 points 0 points 0 points  Count back from 20 0 points 0 points 0 points  Months in reverse 0 points 0 points 0 points  Repeat phrase 0 points 0 points 6 points  Total Score 0 points 0 points 6 points    Immunizations Immunization History  Administered Date(s) Administered   Fluad Quad(high Dose 65+) 11/24/2018, 12/30/2021   Influenza, High Dose Seasonal PF 01/14/2018   Moderna Sars-Covid-2 Vaccination 09/15/2019, 10/17/2019   Pneumococcal Conjugate-13 09/29/2017    TDAP status: Due, Education has been provided regarding the importance of this vaccine. Advised may receive this vaccine at local pharmacy or Health Dept. Aware to provide a copy of the vaccination record if obtained from local pharmacy or Health Dept. Verbalized acceptance and understanding.  Flu Vaccine status: Due, Education has been provided regarding the importance of this vaccine. Advised may receive this vaccine at local pharmacy or Health Dept. Aware to provide a copy of the vaccination record if obtained from local pharmacy or Health Dept. Verbalized acceptance and understanding.  Pneumococcal vaccine status: Due, Education has been provided regarding the importance of this vaccine. Advised may receive this vaccine at local pharmacy or Health Dept. Aware to provide a copy of the vaccination record if obtained from local pharmacy or Health Dept. Verbalized acceptance and understanding.  Covid-19 vaccine status: Declined, Education has been provided regarding the importance of this vaccine but patient still declined. Advised may receive this vaccine at local pharmacy or Health Dept.or vaccine clinic. Aware to provide a copy of the vaccination  record if obtained from local pharmacy or Health Dept. Verbalized acceptance and understanding.  Qualifies for Shingles Vaccine? Yes   Zostavax completed No   Shingrix Completed?: No.    Education has been provided regarding the importance of this vaccine. Patient has been  advised to call insurance company to determine out of pocket expense if they have not yet received this vaccine. Advised may also receive vaccine at local pharmacy or Health Dept. Verbalized acceptance and understanding.  Screening Tests Health Maintenance  Topic Date Due   DTaP/Tdap/Td (1 - Tdap) Never done   Colonoscopy  Never done   Pneumonia Vaccine 53+ Years old (2 of 2 - PPSV23 or PCV20) 09/30/2018   Lung Cancer Screening  11/26/2018   COVID-19 Vaccine (3 - 2023-24 season) 11/11/2022 (Originally 10/11/2022)   Zoster Vaccines- Shingrix (1 of 2) 01/25/2023 (Originally 11/09/1999)   INFLUENZA VACCINE  05/10/2023 (Originally 09/10/2022)   Medicare Annual Wellness (AWV)  11/10/2023   Hepatitis C Screening  Completed   HPV VACCINES  Aged Out    Health Maintenance  Health Maintenance Due  Topic Date Due   DTaP/Tdap/Td (1 - Tdap) Never done   Colonoscopy  Never done   Pneumonia Vaccine 79+ Years old (2 of 2 - PPSV23 or PCV20) 09/30/2018   Lung Cancer Screening  11/26/2018    Colorectal cancer screening: Referral to GI placed Patient declined. Pt aware the office will call re: appt.    Additional Screening:  Hepatitis C Screening: does qualify; Completed 11/09/22  Vision Screening: Recommended annual ophthalmology exams for early detection of glaucoma and other disorders of the eye. Is the patient up to date with their annual eye exam?  Yes  Who is the provider or what is the name of the office in which the patient attends annual eye exams? [facility] If pt is not established with a provider, would they like to be referred to a provider to establish care? No .   Dental Screening: Recommended annual dental exams for proper oral hygiene   Community Resource Referral / Chronic Care Management:  CRR required this visit?  No   CCM required this visit?  No     Plan:     I have personally reviewed and noted the following in the patient's chart:   Medical and social  history Use of alcohol, tobacco or illicit drugs  Current medications and supplements including opioid prescriptions. Patient is not currently taking opioid prescriptions. Functional ability and status Nutritional status Physical activity Advanced directives List of other physicians Hospitalizations, surgeries, and ER visits in previous 12 months Vitals Screenings to include cognitive, depression, and falls Referrals and appointments  In addition, I have reviewed and discussed with patient certain preventive protocols, quality metrics, and best practice recommendations. A written personalized care plan for preventive services as well as general preventive health recommendations were provided to patient.     Tillie Rung, LPN   10/17/1189   After Visit Summary: (MyChart) Due to this being a telephonic visit, the after visit summary with patients personalized plan was offered to patient via MyChart   Nurse Notes: None

## 2022-11-10 NOTE — Patient Instructions (Addendum)
Mr. Schroll , Thank you for taking time to come for your Medicare Wellness Visit. I appreciate your ongoing commitment to your health goals. Please review the following plan we discussed and let me know if I can assist you in the future.   Referrals/Orders/Follow-Ups/Clinician Recommendations:   This is a list of the screening recommended for you and due dates:  Health Maintenance  Topic Date Due   DTaP/Tdap/Td vaccine (1 - Tdap) Never done   Colon Cancer Screening  Never done   Pneumonia Vaccine (2 of 2 - PPSV23 or PCV20) 09/30/2018   Screening for Lung Cancer  11/26/2018   COVID-19 Vaccine (3 - 2023-24 season) 11/11/2022*   Zoster (Shingles) Vaccine (1 of 2) 01/25/2023*   Flu Shot  05/10/2023*   Medicare Annual Wellness Visit  11/10/2023   Hepatitis C Screening  Completed   HPV Vaccine  Aged Out  *Topic was postponed. The date shown is not the original due date.    Advanced directives: (Copy Requested) Please bring a copy of your health care power of attorney and living will to the office to be added to your chart at your convenience.  Next Medicare Annual Wellness Visit scheduled for next year: Yes

## 2022-11-30 ENCOUNTER — Other Ambulatory Visit: Payer: Self-pay | Admitting: Family Medicine

## 2022-11-30 DIAGNOSIS — H25042 Posterior subcapsular polar age-related cataract, left eye: Secondary | ICD-10-CM | POA: Diagnosis not present

## 2022-11-30 DIAGNOSIS — H18413 Arcus senilis, bilateral: Secondary | ICD-10-CM | POA: Diagnosis not present

## 2022-11-30 DIAGNOSIS — H2589 Other age-related cataract: Secondary | ICD-10-CM | POA: Diagnosis not present

## 2022-11-30 DIAGNOSIS — Z1211 Encounter for screening for malignant neoplasm of colon: Secondary | ICD-10-CM

## 2022-11-30 DIAGNOSIS — H2512 Age-related nuclear cataract, left eye: Secondary | ICD-10-CM | POA: Diagnosis not present

## 2022-12-28 ENCOUNTER — Other Ambulatory Visit: Payer: Self-pay | Admitting: Nurse Practitioner

## 2022-12-28 DIAGNOSIS — I10 Essential (primary) hypertension: Secondary | ICD-10-CM

## 2022-12-28 NOTE — Telephone Encounter (Signed)
William Hogan.  This one is for you. Thanks [employer]

## 2022-12-29 DIAGNOSIS — H2512 Age-related nuclear cataract, left eye: Secondary | ICD-10-CM | POA: Diagnosis not present

## 2023-02-11 ENCOUNTER — Other Ambulatory Visit: Payer: Self-pay

## 2023-02-11 DIAGNOSIS — I10 Essential (primary) hypertension: Secondary | ICD-10-CM

## 2023-02-11 DIAGNOSIS — Z0000 Encounter for general adult medical examination without abnormal findings: Secondary | ICD-10-CM

## 2023-02-11 DIAGNOSIS — E782 Mixed hyperlipidemia: Secondary | ICD-10-CM

## 2023-02-18 ENCOUNTER — Other Ambulatory Visit: Payer: Medicare Other

## 2023-02-18 DIAGNOSIS — E782 Mixed hyperlipidemia: Secondary | ICD-10-CM

## 2023-02-18 DIAGNOSIS — Z0000 Encounter for general adult medical examination without abnormal findings: Secondary | ICD-10-CM

## 2023-02-18 DIAGNOSIS — I10 Essential (primary) hypertension: Secondary | ICD-10-CM

## 2023-02-19 LAB — CBC WITH DIFFERENTIAL/PLATELET
Basophils Absolute: 0.1 10*3/uL (ref 0.0–0.2)
Basos: 0 %
EOS (ABSOLUTE): 0.2 10*3/uL (ref 0.0–0.4)
Eos: 2 %
Hematocrit: 49 % (ref 37.5–51.0)
Hemoglobin: 16.3 g/dL (ref 13.0–17.7)
Immature Grans (Abs): 0 10*3/uL (ref 0.0–0.1)
Immature Granulocytes: 0 %
Lymphocytes Absolute: 4.2 10*3/uL — ABNORMAL HIGH (ref 0.7–3.1)
Lymphs: 35 %
MCH: 28.9 pg (ref 26.6–33.0)
MCHC: 33.3 g/dL (ref 31.5–35.7)
MCV: 87 fL (ref 79–97)
Monocytes Absolute: 0.6 10*3/uL (ref 0.1–0.9)
Monocytes: 5 %
Neutrophils Absolute: 6.9 10*3/uL (ref 1.4–7.0)
Neutrophils: 58 %
Platelets: 311 10*3/uL (ref 150–450)
RBC: 5.64 x10E6/uL (ref 4.14–5.80)
RDW: 12.8 % (ref 11.6–15.4)
WBC: 12.1 10*3/uL — ABNORMAL HIGH (ref 3.4–10.8)

## 2023-02-19 LAB — COMPREHENSIVE METABOLIC PANEL
ALT: 26 [IU]/L (ref 0–44)
AST: 32 [IU]/L (ref 0–40)
Albumin: 4 g/dL (ref 3.8–4.8)
Alkaline Phosphatase: 92 [IU]/L (ref 44–121)
BUN/Creatinine Ratio: 21 (ref 10–24)
BUN: 21 mg/dL (ref 8–27)
Bilirubin Total: 0.7 mg/dL (ref 0.0–1.2)
CO2: 22 mmol/L (ref 20–29)
Calcium: 9.2 mg/dL (ref 8.6–10.2)
Chloride: 100 mmol/L (ref 96–106)
Creatinine, Ser: 1.02 mg/dL (ref 0.76–1.27)
Globulin, Total: 2.6 g/dL (ref 1.5–4.5)
Glucose: 102 mg/dL — ABNORMAL HIGH (ref 70–99)
Potassium: 3.9 mmol/L (ref 3.5–5.2)
Sodium: 140 mmol/L (ref 134–144)
Total Protein: 6.6 g/dL (ref 6.0–8.5)
eGFR: 78 mL/min/{1.73_m2} (ref >59)

## 2023-02-19 LAB — TSH: TSH: 2.09 u[IU]/mL (ref 0.450–4.500)

## 2023-02-19 LAB — LIPID PANEL
Chol/HDL Ratio: 2.4 {ratio} (ref 0.0–5.0)
Cholesterol, Total: 132 mg/dL (ref 100–199)
HDL: 54 mg/dL (ref >39)
LDL Chol Calc (NIH): 66 mg/dL (ref 0–99)
Triglycerides: 55 mg/dL (ref 0–149)
VLDL Cholesterol Cal: 12 mg/dL (ref 5–40)

## 2023-02-19 LAB — HEMOGLOBIN A1C
Est. average glucose Bld gHb Est-mCnc: 120 mg/dL
Hgb A1c MFr Bld: 5.8 % — ABNORMAL HIGH (ref 4.8–5.6)

## 2023-02-25 ENCOUNTER — Encounter: Payer: Self-pay | Admitting: Family Medicine

## 2023-02-25 ENCOUNTER — Ambulatory Visit: Payer: Medicare Other | Admitting: Family Medicine

## 2023-02-25 VITALS — BP 171/76 | HR 79 | Ht 71.0 in | Wt 186.8 lb

## 2023-02-25 DIAGNOSIS — E782 Mixed hyperlipidemia: Secondary | ICD-10-CM

## 2023-02-25 DIAGNOSIS — Z122 Encounter for screening for malignant neoplasm of respiratory organs: Secondary | ICD-10-CM

## 2023-02-25 DIAGNOSIS — I10 Essential (primary) hypertension: Secondary | ICD-10-CM

## 2023-02-25 DIAGNOSIS — Z0000 Encounter for general adult medical examination without abnormal findings: Secondary | ICD-10-CM

## 2023-02-25 DIAGNOSIS — D72820 Lymphocytosis (symptomatic): Secondary | ICD-10-CM | POA: Diagnosis not present

## 2023-02-25 DIAGNOSIS — R7303 Prediabetes: Secondary | ICD-10-CM

## 2023-02-25 MED ORDER — LISINOPRIL 40 MG PO TABS: 40.0000 mg | ORAL_TABLET | Freq: Every day | ORAL | 1 refills | Status: DC

## 2023-02-25 MED ORDER — AMLODIPINE BESYLATE 10 MG PO TABS: 10.0000 mg | ORAL_TABLET | Freq: Every day | ORAL | 1 refills | Status: DC

## 2023-02-25 MED ORDER — BISOPROLOL FUMARATE 10 MG PO TABS: 10.0000 mg | ORAL_TABLET | Freq: Every day | ORAL | 1 refills | Status: DC

## 2023-02-25 MED ORDER — ATORVASTATIN CALCIUM 80 MG PO TABS: 80.0000 mg | ORAL_TABLET | Freq: Every day | ORAL | 1 refills | Status: DC

## 2023-02-25 NOTE — Patient Instructions (Signed)
Start taking your lisinopril in the morning instead of at night, and keep a blood pressure log for the next 2 weeks. Bring it to your BP check appointment!  [facility] will call you to schedule the lung scan.  Hematology (blood doctor) will call you to schedule an appointment.

## 2023-02-25 NOTE — Assessment & Plan Note (Signed)
WBC and lymphocytes have remained elevated for several months now.  Referring to hematology for further evaluation.

## 2023-02-25 NOTE — Assessment & Plan Note (Signed)
BP goal <130/80.  Blood pressure above goal.  Continue lisinopril 40 mg daily, amlodipine 10 mg daily, bisoprolol-hydrochlorothiazide 5-6.25 mg daily but move lisinopril dose to the morning instead of at night.  Recheck blood pressure in 2 weeks, if blood pressure still remains above goal in office and in blood pressure log recommend switching all blood pressure medicines to morning dosing.  Will continue to monitor

## 2023-02-25 NOTE — Assessment & Plan Note (Signed)
A1c elevated but fairly stable at 5.8.  Will continue to monitor.

## 2023-02-25 NOTE — Assessment & Plan Note (Signed)
Last lipid panel: LDL 66, HDL 54, triglycerides 55.  Hepatic function within normal limits.  Continue atorvastatin 80 mg daily.  Will continue to monitor.

## 2023-02-25 NOTE — Progress Notes (Signed)
Complete physical exam  Patient: William SOLAND Sr.   DOB: Mar 08, 1950   73 y.o. Male  MRN: [id number]  Subjective:    Chief Complaint  Patient presents with   Annual Exam    William WINSON Sr. is a 73 y.o. male who presents today for a complete physical exam. He reports consuming a general diet.  He generally feels well. He reports sleeping well. He does not have additional problems to discuss today.  He notes that he has not had another episode of dizziness for at least 6 months, but would like his ears checked to make sure that they remain clear.   Most recent fall risk assessment:    11/10/2022    1:33 PM  Fall Risk   Falls in the past year? 0  Number falls in past yr: 0  Injury with Fall? 0  Risk for fall due to : No Fall Risks  Follow up Falls prevention discussed     Most recent depression and anxiety screenings:    02/25/2023    1:20 PM 11/10/2022    1:32 PM  PHQ 2/9 Scores  PHQ - 2 Score 0 0  PHQ- 9 Score 0       06/18/2022    4:00 PM 12/30/2021    3:01 PM 12/01/2021    2:32 PM 08/15/2021   10:02 AM  GAD 7 : Generalized Anxiety Score  Nervous, Anxious, on Edge 0 0 0 0  Control/stop worrying 2 0 0 0  Worry too much - different things 2 0 0 0  Trouble relaxing 2 0 0 0  Restless 0 0 0 0  Easily annoyed or irritable 0 0 0 0  Afraid - awful might happen 0 0 1 0  Total GAD 7 Score 6 0 1 0  Anxiety Difficulty Not difficult at all Not difficult at all  Not difficult at all    Patient Active Problem List   Diagnosis Date Noted   Lymphocytosis 02/25/2023   Prediabetes 02/25/2023   Chronic otitis media 07/07/2021   Bilateral hearing loss 12/05/2020   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 No pertinent surgical history. Social History   Tobacco Use   Smoking status: Former     Current packs/day: 0.00    Average packs/day: 1.5 packs/day for 40.0 years (60.0 ttl pk-yrs)    Types: Cigarettes    Start date: 09/05/1976    Quit date: 09/05/2016    Years since quitting: 6.4    Passive exposure: Never   Smokeless tobacco: Never  Vaping Use   Vaping status: Never Used  Substance Use Topics   Alcohol use: No    Comment: Quit drinking alcohol 10/20/2010   Drug use: No   Family History  Problem Relation Age of Onset   Stroke Father    Allergies  Allergen Reactions   Clonidine Derivatives Other (See Comments)    Dizzy and sleepy     Patient Care Team: [name], PA as PCP - General (Family Medicine) Elder Negus, MD as Consulting Physician (Cardiology) Lanelle Bal, MD (Inactive) as Resident (Internal Medicine) Aida Puffer, MD as Referring Physician (Family Medicine)   Outpatient Medications Prior to Visit  Medication Sig   meclizine (ANTIVERT) 25 MG tablet TAKE 1 TABLET BY MOUTH 3 TIMES DAILY.   [DISCONTINUED] amLODipine (NORVASC)  10 MG tablet Take 1 tablet (10 mg total) by mouth daily.   [DISCONTINUED] atorvastatin (LIPITOR) 80 MG tablet TAKE 1 TABLET BY MOUTH DAILY   [DISCONTINUED] bisoprolol (ZEBETA) 10 MG tablet Take 1 tablet (10 mg total) by mouth daily.   [DISCONTINUED] lisinopril (ZESTRIL) 40 MG tablet Take 1 tablet (40 mg total) by mouth daily.   No facility-administered medications prior to visit.    Review of Systems  Constitutional:  Negative for chills, fever and malaise/fatigue.  HENT:  Negative for congestion and hearing loss.   Eyes:  Negative for blurred vision and double vision.  Respiratory:  Negative for cough and shortness of breath.   Cardiovascular:  Negative for chest pain, palpitations and leg swelling.  Gastrointestinal:  Negative for abdominal pain, constipation, diarrhea and heartburn.  Genitourinary:  Negative for frequency and urgency.  Musculoskeletal:  Negative for myalgias and neck pain.   Neurological:  Negative for headaches.  Endo/Heme/Allergies:  Negative for polydipsia.  Psychiatric/Behavioral:  Negative for depression. The patient is not nervous/anxious.       Objective:    BP (!) 171/76   Pulse 79   Ht 5\' 11"  (1.803 m)   Wt 186 lb 12.8 oz (84.7 kg)   SpO2 98%   BMI 26.05 kg/m    Physical Exam Constitutional:      General: He is not in acute distress.    Appearance: Normal appearance.  HENT:     Head: Normocephalic and atraumatic.     Right Ear: Tympanic membrane, ear canal and external ear normal. There is no impacted cerumen.     Left Ear: Tympanic membrane, ear canal and external ear normal. There is no impacted cerumen.     Nose: Nose normal.     Mouth/Throat:     Mouth: Mucous membranes are moist.     Pharynx: Oropharynx is clear. No posterior oropharyngeal erythema.  Eyes:     Extraocular Movements: Extraocular movements intact.     Conjunctiva/sclera: Conjunctivae normal.     Pupils: Pupils are equal, round, and reactive to light.  Neck:     Thyroid: No thyroid mass, thyromegaly or thyroid tenderness.  Cardiovascular:     Rate and Rhythm: Normal rate and regular rhythm.     Heart sounds: Normal heart sounds. No murmur heard.    No friction rub. No gallop.  Pulmonary:     Effort: Pulmonary effort is normal. No respiratory distress.     Breath sounds: No stridor. Rhonchi (lower lobes bilaterally) present. No wheezing or rales.  Abdominal:     General: Bowel sounds are normal.     Palpations: Abdomen is soft. There is no mass.     Tenderness: There is no abdominal tenderness.  Musculoskeletal:        General: Normal range of motion.     Cervical back: Normal range of motion and neck supple.  Lymphadenopathy:     Cervical: No cervical adenopathy.  Skin:    General: Skin is warm and dry.  Neurological:     Mental Status: He is alert and oriented to person, place, and time.     Cranial Nerves: No cranial nerve deficit.     Motor: No  weakness.     Deep Tendon Reflexes: Reflexes normal.  Psychiatric:        Mood and Affect: Mood normal.        Assessment & Plan:    Routine Health Maintenance and Physical Exam  Immunization History  Administered Date(s) Administered  Fluad Quad(high Dose 65+) 11/24/2018, 12/30/2021   Influenza, High Dose Seasonal PF 01/14/2018   Moderna Sars-Covid-2 Vaccination 09/15/2019, 10/17/2019   Pneumococcal Conjugate-13 09/29/2017    Health Maintenance  Topic Date Due   DTaP/Tdap/Td (1 - Tdap) Never done   Colonoscopy  Never done   Zoster Vaccines- Shingrix (1 of 2) Never done   Pneumonia Vaccine 101+ Years old (2 of 2 - PPSV23 or PCV20) 09/30/2018   Lung Cancer Screening  11/26/2018   COVID-19 Vaccine (3 - 2024-25 season) 10/11/2022   INFLUENZA VACCINE  05/10/2023 (Originally 09/10/2022)   Medicare Annual Wellness (AWV)  11/10/2023   Hepatitis C Screening  Completed   HPV VACCINES  Aged Out    Reviewed most recent labs including CBC, CMP, lipid panel, A1C, TSH, and vitamin D. All within normal limits/stable from last check other than A1c fairly stable 5.8, WBC remain elevated 12.1. Given WBC elevation and history of smoking, patient agreeable to lung cancer screening. At next appointment, we will discuss need for colorectal cancer screening, pneumonia/shingles/tetanus vaccines.  Discussed health benefits of physical activity, and encouraged him to engage in regular exercise appropriate for his age and condition.  Wellness examination  Essential hypertension Assessment & Plan: BP goal <130/80.  Blood pressure above goal.  Continue lisinopril 40 mg daily, amlodipine 10 mg daily, bisoprolol-hydrochlorothiazide 5-6.25 mg daily but move lisinopril dose to the morning instead of at night.  Recheck blood pressure in 2 weeks, if blood pressure still remains above goal in office and in blood pressure log recommend switching all blood pressure medicines to morning dosing.  Will continue to  monitor  Orders: -     amLODIPine Besylate; Take 1 tablet (10 mg total) by mouth daily.  Dispense: 90 tablet; Refill: 1 -     Bisoprolol Fumarate; Take 1 tablet (10 mg total) by mouth daily.  Dispense: 90 tablet; Refill: 1 -     Lisinopril; Take 1 tablet (40 mg total) by mouth daily.  Dispense: 90 tablet; Refill: 1  Mixed hyperlipidemia Assessment & Plan: Last lipid panel: LDL 66, HDL 54, triglycerides 55.  Hepatic function within normal limits.  Continue atorvastatin 80 mg daily.  Will continue to monitor.  Orders: -     Atorvastatin Calcium; Take 1 tablet (80 mg total) by mouth daily.  Dispense: 90 tablet; Refill: 1  Lymphocytosis Assessment & Plan: WBC and lymphocytes have remained elevated for several months now.  Referring to hematology for further evaluation.  Orders: -     Ambulatory referral to Hematology / Oncology  Screening for lung cancer -     CT CHEST LUNG CANCER SCREENING LOW DOSE WO CONTRAST; Future  Prediabetes Assessment & Plan: A1c elevated but fairly stable at 5.8.  Will continue to monitor.     Return in about 2 weeks (around 03/11/2023) for BP check nurse visit; 2 month for HTN, nonfasting labs 1 week before.     [name], PA

## 2023-03-08 ENCOUNTER — Telehealth: Payer: Self-pay | Admitting: Oncology

## 2023-03-08 NOTE — Telephone Encounter (Signed)
Patient is aware of scheduled appointment times,dates,and location; Patient has also been adivised to show up at least 20 mins prior for registration

## 2023-03-11 ENCOUNTER — Ambulatory Visit (INDEPENDENT_AMBULATORY_CARE_PROVIDER_SITE_OTHER): Payer: Medicare Other | Admitting: Family Medicine

## 2023-03-11 VITALS — BP 136/68 | HR 75 | Ht 71.0 in | Wt 186.0 lb

## 2023-03-11 DIAGNOSIS — I159 Secondary hypertension, unspecified: Secondary | ICD-10-CM | POA: Diagnosis not present

## 2023-03-11 NOTE — Progress Notes (Signed)
Patient is here for a recheck of his BP

## 2023-03-18 ENCOUNTER — Encounter: Payer: Self-pay | Admitting: Family Medicine

## 2023-03-22 ENCOUNTER — Ambulatory Visit
Admission: RE | Admit: 2023-03-22 | Discharge: 2023-03-22 | Disposition: A | Discharge status: Home / Self Care | Payer: Medicare Other | Source: Ambulatory Visit | Attending: Family Medicine | Admitting: Family Medicine

## 2023-03-22 DIAGNOSIS — Z122 Encounter for screening for malignant neoplasm of respiratory organs: Secondary | ICD-10-CM

## 2023-04-05 ENCOUNTER — Telehealth: Payer: Self-pay | Admitting: Oncology

## 2023-04-05 ENCOUNTER — Encounter: Payer: Self-pay | Admitting: Family Medicine

## 2023-04-05 DIAGNOSIS — K8020 Calculus of gallbladder without cholecystitis without obstruction: Secondary | ICD-10-CM | POA: Insufficient documentation

## 2023-04-05 NOTE — Telephone Encounter (Signed)
 patient called to reschedule appt. patient wife is in the hospital and unable to make appt. apt rescheduled.

## 2023-04-06 ENCOUNTER — Other Ambulatory Visit: Payer: Self-pay

## 2023-04-06 DIAGNOSIS — E782 Mixed hyperlipidemia: Secondary | ICD-10-CM

## 2023-04-06 DIAGNOSIS — I10 Essential (primary) hypertension: Secondary | ICD-10-CM

## 2023-04-07 ENCOUNTER — Other Ambulatory Visit: Payer: Medicare Other

## 2023-04-07 ENCOUNTER — Encounter: Payer: Medicare Other | Admitting: Oncology

## 2023-04-19 ENCOUNTER — Other Ambulatory Visit: Payer: Medicare Other

## 2023-04-19 DIAGNOSIS — E782 Mixed hyperlipidemia: Secondary | ICD-10-CM

## 2023-04-19 DIAGNOSIS — I10 Essential (primary) hypertension: Secondary | ICD-10-CM

## 2023-04-20 LAB — CBC WITH DIFFERENTIAL/PLATELET
Basophils Absolute: 0.1 10*3/uL (ref 0.0–0.2)
Basos: 0 %
EOS (ABSOLUTE): 0.1 10*3/uL (ref 0.0–0.4)
Eos: 1 %
Hematocrit: 50.2 % (ref 37.5–51.0)
Hemoglobin: 17.2 g/dL (ref 13.0–17.7)
Immature Grans (Abs): 0.1 10*3/uL (ref 0.0–0.1)
Immature Granulocytes: 1 %
Lymphocytes Absolute: 4.7 10*3/uL — ABNORMAL HIGH (ref 0.7–3.1)
Lymphs: 32 %
MCH: 29.5 pg (ref 26.6–33.0)
MCHC: 34.3 g/dL (ref 31.5–35.7)
MCV: 86 fL (ref 79–97)
Monocytes Absolute: 0.7 10*3/uL (ref 0.1–0.9)
Monocytes: 4 %
Neutrophils Absolute: 9 10*3/uL — ABNORMAL HIGH (ref 1.4–7.0)
Neutrophils: 62 %
Platelets: 378 10*3/uL (ref 150–450)
RBC: 5.84 x10E6/uL — ABNORMAL HIGH (ref 4.14–5.80)
RDW: 12.7 % (ref 11.6–15.4)
WBC: 14.6 10*3/uL — ABNORMAL HIGH (ref 3.4–10.8)

## 2023-04-20 LAB — COMPREHENSIVE METABOLIC PANEL
ALT: 48 IU/L — ABNORMAL HIGH (ref 0–44)
AST: 43 IU/L — ABNORMAL HIGH (ref 0–40)
Albumin: 4.5 g/dL (ref 3.8–4.8)
Alkaline Phosphatase: 112 IU/L (ref 44–121)
BUN/Creatinine Ratio: 13 (ref 10–24)
BUN: 16 mg/dL (ref 8–27)
Bilirubin Total: 1 mg/dL (ref 0.0–1.2)
CO2: 20 mmol/L (ref 20–29)
Calcium: 9.7 mg/dL (ref 8.6–10.2)
Chloride: 99 mmol/L (ref 96–106)
Creatinine, Ser: 1.2 mg/dL (ref 0.76–1.27)
Globulin, Total: 2.8 g/dL (ref 1.5–4.5)
Glucose: 108 mg/dL — ABNORMAL HIGH (ref 70–99)
Potassium: 4.3 mmol/L (ref 3.5–5.2)
Sodium: 138 mmol/L (ref 134–144)
Total Protein: 7.3 g/dL (ref 6.0–8.5)
eGFR: 64 mL/min/{1.73_m2} (ref >59)

## 2023-04-26 ENCOUNTER — Encounter: Payer: Self-pay | Admitting: Family Medicine

## 2023-04-26 ENCOUNTER — Ambulatory Visit: Payer: Medicare Other | Admitting: Family Medicine

## 2023-04-26 ENCOUNTER — Ambulatory Visit (INDEPENDENT_AMBULATORY_CARE_PROVIDER_SITE_OTHER): Payer: Medicare Other | Admitting: Family Medicine

## 2023-04-26 VITALS — BP 176/76 | HR 68 | Ht 71.0 in | Wt 185.4 lb

## 2023-04-26 DIAGNOSIS — R7989 Other specified abnormal findings of blood chemistry: Secondary | ICD-10-CM | POA: Diagnosis not present

## 2023-04-26 DIAGNOSIS — I10 Essential (primary) hypertension: Secondary | ICD-10-CM | POA: Diagnosis not present

## 2023-04-26 DIAGNOSIS — E782 Mixed hyperlipidemia: Secondary | ICD-10-CM

## 2023-04-26 DIAGNOSIS — D72820 Lymphocytosis (symptomatic): Secondary | ICD-10-CM

## 2023-04-26 DIAGNOSIS — Z636 Dependent relative needing care at home: Secondary | ICD-10-CM

## 2023-04-26 DIAGNOSIS — H259 Unspecified age-related cataract: Secondary | ICD-10-CM

## 2023-04-26 NOTE — Patient Instructions (Addendum)
 It was nice to see you today,  We addressed the following topics today: -Please take all of your daily medications in the morning now.  This includes your lisinopril, amlodipine and bisoprolol.  You can also take the cholesterol medicine in the morning as well. - Let us know if you feel like your blood pressure is too low with this change. - You have an appointment with the hematologist on 3/20.  Please go to this appointment but if you have to reschedule please let them know.  Have a great day,  Frederic Jericho, MD

## 2023-04-26 NOTE — Progress Notes (Unsigned)
 Established Patient Office Visit  Subjective   Patient ID: William Byrom., male    DOB: [birth year]  Age: 73 y.o. MRN: [id number]  Chief Complaint  Patient presents with   Medical Management of Chronic Issues    HPI  HTN -patient's blood pressure is elevated today.  He takes his lisinopril in the morning and amlodipine and bisoprolol in the evening.  Does not take the bisoprolol with the hydrochlorothiazide in it.  States that his blood pressure is better at home and recently brought in the home logs.  We went over these home readings.  He is agreeable to switching all his dosing to the morning.  HLD-patient still taking his statin.  No issues with this.  We discussed the patient's upcoming appointment with the hematologist, Dr. Arlana Pouch, on the 20th.  Patient is aware of this appointment.  Patient's wife had a stroke in December 2023.  She is currently in a nursing home in [place].  He recently transitioned her to hospice last week.  Patient had cataract surgery on his left eye in November.   This led to pain and irritation in the eye.  He recently saw a ophthalmologist and had a laser treatment that has improved his eye pain and vision significantly.  The 10-year ASCVD risk score (Arnett DK, et al., 2019) is: 34.2%  Health Maintenance Due  Topic Date Due   DTaP/Tdap/Td (1 - Tdap) Never done   Colonoscopy  Never done   Zoster Vaccines- Shingrix (1 of 2) Never done   Pneumonia Vaccine 73+ Years old (2 of 2 - PPSV23 or PCV20) 09/30/2018   COVID-19 Vaccine (3 - 2024-25 season) 10/11/2022      Objective:     BP (!) 176/76   Pulse 68   Ht 5\' 11"  (1.803 m)   Wt 185 lb 6.4 oz (84.1 kg)   SpO2 96%   BMI 25.86 kg/m    Physical Exam General: Alert, oriented HEENT: Left eye with conjunctival erythema and tearing. CV: Regular rhythm Pulmonary: Lungs clear bilaterally   No results found for any visits on 04/26/23.      Assessment & Plan:   Essential  hypertension Assessment & Plan: Blood pressure readings in the office are elevated and all readings are still above goal although better than our readings in the office.  Patient says he has had issues with blood pressure getting too low after addition of blood pressure medications.  Will have patient switch his medications to taking them all in the morning instead of evenings.  Advised to continue rechecking at home.    Orders: -     Aldosterone + renin activity w/ ratio; Future  Mixed hyperlipidemia -     Lipid panel; Future  Lymphocytosis -     CBC with Differential/Platelet; Future  Elevated LFTs -     Comprehensive metabolic panel; Future  Age-related cataract of both eyes, unspecified age-related cataract type Assessment & Plan: Patient recently had cataract surgery that resulted in redness and irritation of the left eye.  He has since had a another laser procedure that improved the pain in his eye.  Exam left eye shows injected sclera.  Continue management per ophthalmology   Caregiver stress Assessment & Plan: Patient's wife has been living in a nursing facility in [place] since late 2023 due to her stroke.  Per the patient, he transitioned his wife to hospice care last week.       Return in about 3 months (around  07/27/2023) for HTN, hld.    Sandre Kitty, MD

## 2023-04-28 DIAGNOSIS — Z636 Dependent relative needing care at home: Secondary | ICD-10-CM | POA: Insufficient documentation

## 2023-04-28 DIAGNOSIS — H269 Unspecified cataract: Secondary | ICD-10-CM | POA: Insufficient documentation

## 2023-04-28 NOTE — Assessment & Plan Note (Signed)
 Patient recently had cataract surgery that resulted in redness and irritation of the left eye.  He has since had a another laser procedure that improved the pain in his eye.  Exam left eye shows injected sclera.  Continue management per ophthalmology

## 2023-04-28 NOTE — Assessment & Plan Note (Signed)
 Blood pressure readings in the office are elevated and all readings are still above goal although better than our readings in the office.  Patient says he has had issues with blood pressure getting too low after addition of blood pressure medications.  Will have patient switch his medications to taking them all in the morning instead of evenings.  Advised to continue rechecking at home.

## 2023-04-28 NOTE — Assessment & Plan Note (Signed)
 Patient's wife has been living in a nursing facility in [place] since late 2023 due to her stroke.  Per the patient, he transitioned his wife to hospice care last week.

## 2023-04-29 ENCOUNTER — Encounter: Payer: Self-pay | Admitting: Oncology

## 2023-04-29 ENCOUNTER — Inpatient Hospital Stay: Payer: Medicare Other | Attending: Oncology | Admitting: Oncology

## 2023-04-29 ENCOUNTER — Inpatient Hospital Stay: Payer: Medicare Other

## 2023-04-29 VITALS — BP 160/82 | HR 70 | Temp 98.7°F | Resp 18 | Ht 71.0 in | Wt 183.2 lb

## 2023-04-29 DIAGNOSIS — Z87891 Personal history of nicotine dependence: Secondary | ICD-10-CM | POA: Diagnosis not present

## 2023-04-29 DIAGNOSIS — D72820 Lymphocytosis (symptomatic): Secondary | ICD-10-CM

## 2023-04-29 LAB — CBC WITH DIFFERENTIAL (CANCER CENTER ONLY)
Abs Immature Granulocytes: 0.04 10*3/uL (ref 0.00–0.07)
Basophils Absolute: 0.1 10*3/uL (ref 0.0–0.1)
Basophils Relative: 0 %
Eosinophils Absolute: 0.1 10*3/uL (ref 0.0–0.5)
Eosinophils Relative: 1 %
HCT: 46.6 % (ref 39.0–52.0)
Hemoglobin: 15.9 g/dL (ref 13.0–17.0)
Immature Granulocytes: 0 %
Lymphocytes Relative: 35 %
Lymphs Abs: 4 10*3/uL (ref 0.7–4.0)
MCH: 29 pg (ref 26.0–34.0)
MCHC: 34.1 g/dL (ref 30.0–36.0)
MCV: 84.9 fL (ref 80.0–100.0)
Monocytes Absolute: 0.5 10*3/uL (ref 0.1–1.0)
Monocytes Relative: 5 %
Neutro Abs: 6.8 10*3/uL (ref 1.7–7.7)
Neutrophils Relative %: 59 %
Platelet Count: 300 10*3/uL (ref 150–400)
RBC: 5.49 MIL/uL (ref 4.22–5.81)
RDW: 13.3 % (ref 11.5–15.5)
WBC Count: 11.5 10*3/uL — ABNORMAL HIGH (ref 4.0–10.5)
nRBC: 0 % (ref 0.0–0.2)

## 2023-04-29 LAB — CMP (CANCER CENTER ONLY)
ALT: 26 U/L (ref 0–44)
AST: 23 U/L (ref 15–41)
Albumin: 4 g/dL (ref 3.5–5.0)
Alkaline Phosphatase: 76 U/L (ref 38–126)
Anion gap: 5 (ref 5–15)
BUN: 16 mg/dL (ref 8–23)
CO2: 29 mmol/L (ref 22–32)
Calcium: 9.1 mg/dL (ref 8.9–10.3)
Chloride: 103 mmol/L (ref 98–111)
Creatinine: 1.24 mg/dL (ref 0.61–1.24)
GFR, Estimated: >60 mL/min (ref >60)
Glucose, Bld: 102 mg/dL — ABNORMAL HIGH (ref 70–99)
Potassium: 3.9 mmol/L (ref 3.5–5.1)
Sodium: 137 mmol/L (ref 135–145)
Total Bilirubin: 0.9 mg/dL (ref 0.0–1.2)
Total Protein: 6.7 g/dL (ref 6.5–8.1)

## 2023-04-29 LAB — SEDIMENTATION RATE: Sed Rate: 2 mm/h (ref 0–16)

## 2023-04-29 LAB — C-REACTIVE PROTEIN: CRP: 0.5 mg/dL (ref <1.0)

## 2023-04-29 LAB — LACTATE DEHYDROGENASE: LDH: 162 U/L (ref 98–192)

## 2023-04-29 NOTE — Progress Notes (Signed)
 [facility]  HEMATOLOGY CLINIC CONSULTATION NOTE   PATIENT NAME: William Hogan.   MR#: [id number] DOB: [birth year]  DATE OF SERVICE: 04/29/2023   Patient Care Team: Sandre Kitty, MD as PCP - General (Family Medicine) Elder Negus, MD as Consulting Physician (Cardiology) Lanelle Bal, MD (Inactive) as Resident (Internal Medicine) Aida Puffer, MD as Referring Physician (Family Medicine)   REASON FOR CONSULTATION/ CHIEF COMPLAINT: Evaluation of lymphocytosis  ASSESSMENT & PLAN:  William LAUNER Sr. is a 73 y.o. pleasant gentleman with a past medical history of hypertension, dyslipidemia, was referred to our service for evaluation of lymphocytosis.    Lymphocytosis His white blood cell count has been slightly elevated, ranging from 12,000 to 14,000, since 2018, with a specific increase in lymphocytes recently.   Absolute lymphocyte count was 4500 in September 2024, 4200 on 02/18/2023 and 4700 on 04/19/2023.  Otherwise normal differential.  Hemoglobin and platelet count have been within normal limits.  He is asymptomatic, with no recurrent infections, fevers, night sweats, weight loss, or lymphadenopathy or other constitutional symptoms.  Discussed various possible etiologies including monoclonal B-cell lymphocytosis versus developing CLL.  Labs today actually showed improved white count of 11,500 with normal lymphocyte count of 4000, normal differential.  Hemoglobin and platelet count are within normal limits.  CMP unremarkable.  ESR, LDH are within normal limits.  Given recent lymphocytosis, we obtained flow cytometry of peripheral blood.  Will follow-up on the results and call him in 2 weeks to discuss results.  - Schedule follow-up appointment in three months    - Monitor blood counts every three months initially, then every six months if stable      I reviewed lab results and outside records for this visit and discussed relevant  results with the patient. Diagnosis, plan of care and treatment options were also discussed in detail with the patient. Opportunity provided to ask questions and answers provided to his apparent satisfaction. Provided instructions to call our clinic with any problems, questions or concerns prior to return visit. I recommended to continue follow-up with PCP and sub-specialists. He verbalized understanding and agreed with the plan. No barriers to learning was detected.  Meryl Crutch, MD  04/29/2023 4:51 PM  [facility] - A DEPT OF [facility] [address] [place] [address] Dept: [phone] Dept Fax: [phone]   HISTORY OF PRESENT ILLNESS:  Discussed the use of AI scribe software for clinical note transcription with the patient, who gave verbal consent to proceed.   Routine labs at his PCPs office on 02/18/2023 showed white count of 12,100 with lymphocyte count of 4200, otherwise normal differential.  Previously lymphocyte count was 4500 in September 2024.  Given persistent lymphocytosis, referral was sent to [place] for further evaluation.  The patient's white blood cell count has been slightly elevated for the past seven years, ranging from 12,000 to 14,000, with a recent increase in lymphocytes. The patient denies any recurrent infections or other symptoms suggestive of an active infection.  The patient is also dealing with significant personal stress, as his wife of 53 years recently suffered a massive stroke and is now in hospice care. She is unable to talk or walk and is being fed through a tube in his belly. The patient signed a DNR for his wife and has been dealing with the emotional and logistical challenges of her care.  He denies fever, cough, diarrhea,  or other infectious symptoms.  He denies epistaxis, bloody stool, melena, hematuria, bruising or other bleeding symptoms. He also denies unintentional weight loss, night  sweats or other constitutional symptoms.  MEDICAL HISTORY Past Medical History:  Diagnosis Date   High cholesterol    Hypertension      SURGICAL HISTORY No past surgical history on file.   SOCIAL HISTORY: He reports that he quit smoking about 6 years ago. His smoking use included cigarettes. He started smoking about 46 years ago. He has a 60 pack-year smoking history. He has never been exposed to tobacco smoke. He has never used smokeless tobacco. He reports that he does not drink alcohol and does not use drugs. Social History   Socioeconomic History   Marital status: Married    Spouse name: Ann   Number of children: 3   Years of education: Not on file   Highest education level: High school graduate  Occupational History   Not on file  Tobacco Use   Smoking status: Former    Current packs/day: 0.00    Average packs/day: 1.5 packs/day for 40.0 years (60.0 ttl pk-yrs)    Types: Cigarettes    Start date: 09/05/1976    Quit date: 09/05/2016    Years since quitting: 6.6    Passive exposure: Never   Smokeless tobacco: Never  Vaping Use   Vaping status: Never Used  Substance and Sexual Activity   Alcohol use: No    Comment: Quit drinking alcohol 10/20/2010   Drug use: No   Sexual activity: Not Currently  Other Topics Concern   Not on file  Social History Narrative   Not on file   Social Drivers of Health   Financial Resource Strain: Low Risk  (11/10/2022)   Overall Financial Resource Strain (CARDIA)    Difficulty of Paying Living Expenses: Not hard at all  Food Insecurity: No Food Insecurity (11/10/2022)   Hunger Vital Sign    Worried About Running Out of Food in the Last Year: Never true    Ran Out of Food in the Last Year: Never true  Transportation Needs: No Transportation Needs (11/10/2022)   PRAPARE - Administrator, Civil Service (Medical): No    Lack of Transportation (Non-Medical): No  Physical Activity: Inactive (11/10/2022)   Exercise Vital Sign     Days of Exercise per Week: 0 days    Minutes of Exercise per Session: 0 min  Stress: No Stress Concern Present (08/15/2021)   [employer] of Occupational Health - Occupational Stress Questionnaire    Feeling of Stress : Only a little  Social Connections: Moderately Isolated (11/10/2022)   Social Connection and Isolation Panel [NHANES]    Frequency of Communication with Friends and Family: More than three times a week    Frequency of Social Gatherings with Friends and Family: More than three times a week    Attends Religious Services: Never    Database administrator or Organizations: No    Attends Banker Meetings: Never    Marital Status: Married  Catering manager Violence: Not At Risk (11/10/2022)   Humiliation, Afraid, Rape, and Kick questionnaire    Fear of Current or Ex-Partner: No    Emotionally Abused: No    Physically Abused: No    Sexually Abused: No    FAMILY HISTORY: His family history includes Stroke in his father.  CURRENT MEDICATIONS   Current Outpatient Medications  Medication Instructions   amLODipine (NORVASC) 10 mg, Oral, Daily  atorvastatin (LIPITOR) 80 mg, Oral, Daily   bisoprolol (ZEBETA) 10 mg, Oral, Daily   lisinopril (ZESTRIL) 40 mg, Oral, Daily   meclizine (ANTIVERT) 25 mg, Oral, 3 times daily     ALLERGIES  He is allergic to clonidine derivatives.  REVIEW OF SYSTEMS:  Review of Systems - Oncology   Rest of the pertinent review of systems is unremarkable except as mentioned above in HPI.  PHYSICAL EXAMINATION:    Onc Performance Status - 04/29/23 1347       ECOG Perf Status   ECOG Perf Status Ambulatory and capable of all selfcare but unable to carry out any work activities.  Up and about more than 50% of waking hours      KPS SCALE   KPS % SCORE Cares for self, unable to carry on normal activity or to do active work             Vitals:   04/29/23 1336  BP: (!) 160/82  Pulse: 70  Resp: 18  Temp: 98.7 F (37.1  C)  SpO2: 96%   Filed Weights   04/29/23 1336  Weight: 183 lb 3.2 oz (83.1 kg)    Physical Exam Constitutional:      General: He is not in acute distress.    Appearance: Normal appearance.  HENT:     Head: Normocephalic and atraumatic.  Eyes:     General: No scleral icterus.    Conjunctiva/sclera: Conjunctivae normal.  Cardiovascular:     Rate and Rhythm: Normal rate and regular rhythm.     Heart sounds: Normal heart sounds.  Pulmonary:     Effort: Pulmonary effort is normal.     Breath sounds: Normal breath sounds.  Abdominal:     General: There is no distension.  Musculoskeletal:     Right lower leg: No edema.     Left lower leg: No edema.  Lymphadenopathy:     Cervical: No cervical adenopathy.  Neurological:     General: No focal deficit present.     Mental Status: He is alert and oriented to person, place, and time.  Psychiatric:        Mood and Affect: Mood normal.        Behavior: Behavior normal.        Thought Content: Thought content normal.      LABORATORY DATA:   I have reviewed the data as listed.  Results for orders placed or performed in visit on 04/29/23  Sedimentation rate  Result Value Ref Range   Sed Rate 2 0 - 16 mm/hr  Lactate dehydrogenase  Result Value Ref Range   LDH 162 98 - 192 U/L  CMP (Cancer Center only)  Result Value Ref Range   Sodium 137 135 - 145 mmol/L   Potassium 3.9 3.5 - 5.1 mmol/L   Chloride 103 98 - 111 mmol/L   CO2 29 22 - 32 mmol/L   Glucose, Bld 102 (H) 70 - 99 mg/dL   BUN 16 8 - 23 mg/dL   Creatinine 4.40 3.47 - 1.24 mg/dL   Calcium 9.1 8.9 - 42.5 mg/dL   Total Protein 6.7 6.5 - 8.1 g/dL   Albumin 4.0 3.5 - 5.0 g/dL   AST 23 15 - 41 U/L   ALT 26 0 - 44 U/L   Alkaline Phosphatase 76 38 - 126 U/L   Total Bilirubin 0.9 0.0 - 1.2 mg/dL   GFR, Estimated >95 >63 mL/min   Anion gap 5 5 - 15  CBC with Differential (Cancer Center Only)  Result Value Ref Range   WBC Count 11.5 (H) 4.0 - 10.5 K/uL   RBC 5.49 4.22  - 5.81 MIL/uL   Hemoglobin 15.9 13.0 - 17.0 g/dL   HCT 40.9 81.1 - 91.4 %   MCV 84.9 80.0 - 100.0 fL   MCH 29.0 26.0 - 34.0 pg   MCHC 34.1 30.0 - 36.0 g/dL   RDW 78.2 95.6 - 21.3 %   Platelet Count 300 150 - 400 K/uL   nRBC 0.0 0.0 - 0.2 %   Neutrophils Relative % 59 %   Neutro Abs 6.8 1.7 - 7.7 K/uL   Lymphocytes Relative 35 %   Lymphs Abs 4.0 0.7 - 4.0 K/uL   Monocytes Relative 5 %   Monocytes Absolute 0.5 0.1 - 1.0 K/uL   Eosinophils Relative 1 %   Eosinophils Absolute 0.1 0.0 - 0.5 K/uL   Basophils Relative 0 %   Basophils Absolute 0.1 0.0 - 0.1 K/uL   Immature Granulocytes 0 %   Abs Immature Granulocytes 0.04 0.00 - 0.07 K/uL    RADIOGRAPHIC STUDIES:   No pertinent imaging studies available to review.  Orders Placed This Encounter  Procedures   CBC with Differential (Cancer Center Only)    Standing Status:   Future    Number of Occurrences:   1    Expiration Date:   04/28/2024   CMP (Cancer Center only)    Standing Status:   Future    Number of Occurrences:   1    Expiration Date:   04/28/2024   Lactate dehydrogenase    Standing Status:   Future    Number of Occurrences:   1    Expiration Date:   04/28/2024   Flow Cytometry, Peripheral Blood (Oncology)    Standing Status:   Future    Number of Occurrences:   1    Expiration Date:   04/28/2024   BCR-ABL1 FISH    Standing Status:   Future    Number of Occurrences:   1    Expiration Date:   04/28/2024   Sedimentation rate    Standing Status:   Future    Number of Occurrences:   1    Expiration Date:   04/28/2024   C-reactive protein    Standing Status:   Future    Number of Occurrences:   1    Expiration Date:   04/28/2024    Future Appointments  Date Time Provider Department Center  07/21/2023 11:00 AM [facility] None  07/27/2023  1:30 PM Sandre Kitty, MD [facility] None  07/30/2023  2:00 PM [facility] None  07/30/2023  2:30 PM Rylen Swindler, MD [facility] None   11/11/2023  1:40 PM [facility]-ANNUAL WELLNESS VISIT [facility] None    I spent a total of 55 minutes during this encounter with the patient including review of chart and various tests results, discussions about plan of care and coordination of care plan.  This document was completed utilizing speech recognition software. Grammatical errors, random word insertions, pronoun errors, and incomplete sentences are an occasional consequence of this system due to software limitations, ambient noise, and hardware issues. Any formal questions or concerns about the content, text or information contained within the body of this dictation should be directly addressed to the provider for clarification.

## 2023-04-29 NOTE — Assessment & Plan Note (Addendum)
 His white blood cell count has been slightly elevated, ranging from 12,000 to 14,000, since 2018, with a specific increase in lymphocytes recently.   Absolute lymphocyte count was 4500 in September 2024, 4200 on 02/18/2023 and 4700 on 04/19/2023.  Otherwise normal differential.  Hemoglobin and platelet count have been within normal limits.  He is asymptomatic, with no recurrent infections, fevers, night sweats, weight loss, or lymphadenopathy or other constitutional symptoms.  Discussed various possible etiologies including monoclonal B-cell lymphocytosis versus developing CLL.  Labs today actually showed improved white count of 11,500 with normal lymphocyte count of 4000, normal differential.  Hemoglobin and platelet count are within normal limits.  CMP unremarkable.  ESR, LDH are within normal limits.  Given recent lymphocytosis, we obtained flow cytometry of peripheral blood.  Will follow-up on the results and call him in 2 weeks to discuss results.  - Schedule follow-up appointment in three months    - Monitor blood counts every three months initially, then every six months if stable

## 2023-04-30 LAB — SURGICAL PATHOLOGY

## 2023-05-03 LAB — FLOW CYTOMETRY

## 2023-05-04 ENCOUNTER — Telehealth: Payer: Self-pay | Admitting: Oncology

## 2023-05-04 LAB — BCR-ABL1 FISH
Cells Analyzed: 200
Cells Counted: 200

## 2023-05-04 NOTE — Telephone Encounter (Signed)
 Patient scheduled appointments. Patient is aware of all appointment details.

## 2023-05-14 ENCOUNTER — Inpatient Hospital Stay: Attending: Oncology | Admitting: Oncology

## 2023-05-14 ENCOUNTER — Encounter: Payer: Self-pay | Admitting: Oncology

## 2023-05-14 DIAGNOSIS — D72820 Lymphocytosis (symptomatic): Secondary | ICD-10-CM

## 2023-05-14 NOTE — Progress Notes (Signed)
 [facility]  HEMATOLOGY-ONCOLOGY ELECTRONIC VISIT PROGRESS NOTE  PATIENT NAME: William Hogan.   MR#: [id number] DOB: [birth year]  DATE OF SERVICE: 05/14/2023  Patient Care Team: Sandre Kitty, MD as PCP - General (Family Medicine) Elder Negus, MD as Consulting Physician (Cardiology) Lanelle Bal, MD (Inactive) as Resident (Internal Medicine) Aida Puffer, MD as Referring Physician (Family Medicine)  I connected with the patient via telephone conference and verified that I am speaking with the correct person using two identifiers. The patient's location is at home and I am providing care from the [facility].  I discussed the limitations, risks, security and privacy concerns of performing an evaluation and management service by e-visits and the availability of in person appointments. I also discussed with the patient that there may be a patient responsible charge related to this service. The patient expressed understanding and agreed to proceed.   ASSESSMENT & PLAN:   William PARDINI Sr. is a 73 y.o. pleasant gentleman with a past medical history of hypertension, dyslipidemia, was referred to our service for evaluation of lymphocytosis.  Lymphocytosis His white blood cell count has been slightly elevated, ranging from 12,000 to 16,000, since 2018, with a specific increase in lymphocytes recently.    Absolute lymphocyte count was 4500 in September 2024, 4200 on 02/18/2023 and 4700 on 04/19/2023.  Otherwise normal differential.  Hemoglobin and platelet count have been within normal limits.    On his consultation with [place] on 04/29/2023, labs actually showed improved white count of 11,500 with normal lymphocyte count of 4000, normal differential.  Hemoglobin and platelet count were within normal limits.  CMP unremarkable.  ESR, CRP, LDH were within normal limits.  BCR/ABL 1 was negative.  Flow cytometry of peripheral blood revealed an abnormal CD5/CD19  positive kappa restricted B-cell population (2,173 cells/microlitre) with an immunophenotype that would be consistent with chronic lymphocytic  leukemia, However, the number of monotypic B-cells does not meet the 2017 WHO quantitative criteria (> 5,000 cells/microliter) for chronic lymphocytic leukemia.  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  No hematological intervention would be needed apart from close monitoring.  He is asymptomatic, with no recurrent infections, fevers, night sweats, weight loss, or lymphadenopathy or other constitutional symptoms.  Monitor blood counts every three months initially, then every six months if stable.    I discussed the assessment and treatment plan with the patient. The patient was provided an opportunity to ask questions and all were answered. The patient agreed with the plan and demonstrated an understanding of the instructions. The patient was advised to call back or seek an in-person evaluation if the symptoms worsen or if the condition fails to improve as anticipated.    I spent 12 minutes over the phone with the patient reviewing test results, discuss management and coordination/planning of care.  Meryl Crutch, MD 05/14/2023 4:19 PM [facility] - A DEPT OF [facility] [address] [place] [address] Dept: [phone] Dept Fax: [phone]   INTERVAL HISTORY:  Please see above for problem oriented charting.  The purpose of today's discussion is to explain recent lab results and to formulate plan of care.  He has been doing well at his baseline health and denies any new complaints compared to last visit.  SUMMARY OF HEMATOLOGY HISTORY:  Routine labs at his PCPs office on 02/18/2023 showed white count of 12,100 with  lymphocyte count of 4200, otherwise normal differential.  Previously lymphocyte count was 4500 in September 2024.  Given persistent lymphocytosis, referral was sent to [place] for further evaluation.    The patient is also dealing with significant personal stress, as his wife of 53 years recently suffered a massive stroke and is now in hospice care. She is unable to talk or walk and is being fed through a tube in his belly. The patient signed a DNR for his wife and has been dealing with the emotional and logistical challenges of her care.  His white blood cell count has been slightly elevated, ranging from 12,000 to 16,000, since 2018, with a specific increase in lymphocytes recently.    Absolute lymphocyte count was 4500 in September 2024, 4200 on 02/18/2023 and 4700 on 04/19/2023.  Otherwise normal differential.  Hemoglobin and platelet count have been within normal limits.    On his consultation with [place] on 04/29/2023, labs actually showed improved white count of 11,500 with normal lymphocyte count of 4000, normal differential.  Hemoglobin and platelet count were within normal limits.  CMP unremarkable.  ESR, CRP, LDH were within normal limits.  BCR/ABL 1 was negative.  Flow cytometry of peripheral blood revealed an abnormal CD5/CD19 positive kappa restricted B-cell population (2,173 cells/microlitre) with an immunophenotype that would be consistent with chronic lymphocytic  leukemia, However, the number of monotypic B-cells does not meet the 2017 WHO quantitative criteria (> 5,000 cells/microliter) for chronic lymphocytic leukemia.  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  No hematological intervention would be needed apart from close monitoring.  He is asymptomatic, with no recurrent infections, fevers, night sweats, weight loss, or lymphadenopathy or  other constitutional symptoms.  Monitor blood counts every three months initially, then every six months if stable.   REVIEW OF SYSTEMS:    Review of Systems - Oncology  All other pertinent systems were reviewed with the patient and are negative.  I have reviewed the past medical history, past surgical history, social history and family history with the patient and they are unchanged from previous note.  ALLERGIES:  He is allergic to clonidine derivatives.  MEDICATIONS:  Current Outpatient Medications  Medication Sig Dispense Refill   amLODipine (NORVASC) 10 MG tablet Take 1 tablet (10 mg total) by mouth daily. 90 tablet 1   atorvastatin (LIPITOR) 80 MG tablet Take 1 tablet (80 mg total) by mouth daily. 90 tablet 1   bisoprolol (ZEBETA) 10 MG tablet Take 1 tablet (10 mg total) by mouth daily. 90 tablet 1   lisinopril (ZESTRIL) 40 MG tablet Take 1 tablet (40 mg total) by mouth daily. 90 tablet 1   meclizine (ANTIVERT) 25 MG tablet TAKE 1 TABLET BY MOUTH 3 TIMES DAILY. (Patient not taking: Reported on 04/29/2023) 270 tablet 0   No current facility-administered medications for this visit.    PHYSICAL EXAMINATION:   Onc Performance Status - 05/14/23 1600       ECOG Perf Status   ECOG Perf Status Restricted in physically strenuous activity but ambulatory and able to carry out work of a light or sedentary nature, e.g., light house work, office work      KPS SCALE   KPS % SCORE Normal activity with effort, some s/s of disease  LABORATORY DATA:   I have reviewed the data as listed.  Recent Results (from the past 2160 hours)  Hemoglobin A1c     Status: Abnormal   Collection Time: 02/18/23  8:52 AM  Result Value Ref Range   Hgb A1c MFr Bld 5.8 (H) 4.8 - 5.6 %    Comment:          Prediabetes: 5.7 - 6.4          Diabetes: >6.4          Glycemic control for adults with diabetes: <7.0    Est. average glucose Bld gHb Est-mCnc 120 mg/dL  TSH     Status: None    Collection Time: 02/18/23  8:52 AM  Result Value Ref Range   TSH 2.090 0.450 - 4.500 uIU/mL  Comp Met (CMET)     Status: Abnormal   Collection Time: 02/18/23  8:52 AM  Result Value Ref Range   Glucose 102 (H) 70 - 99 mg/dL   BUN 21 8 - 27 mg/dL   Creatinine, Ser 7.74 0.76 - 1.27 mg/dL   eGFR 78 >12 IN/OMV/6.72   BUN/Creatinine Ratio 21 10 - 24   Sodium 140 134 - 144 mmol/L   Potassium 3.9 3.5 - 5.2 mmol/L   Chloride 100 96 - 106 mmol/L   CO2 22 20 - 29 mmol/L   Calcium 9.2 8.6 - 10.2 mg/dL   Total Protein 6.6 6.0 - 8.5 g/dL   Albumin 4.0 3.8 - 4.8 g/dL   Globulin, Total 2.6 1.5 - 4.5 g/dL   Bilirubin Total 0.7 0.0 - 1.2 mg/dL   Alkaline Phosphatase 92 44 - 121 IU/L   AST 32 0 - 40 IU/L   ALT 26 0 - 44 IU/L  CBC with Differential/Platelet     Status: Abnormal   Collection Time: 02/18/23  8:52 AM  Result Value Ref Range   WBC 12.1 (H) 3.4 - 10.8 x10E3/uL   RBC 5.64 4.14 - 5.80 x10E6/uL   Hemoglobin 16.3 13.0 - 17.7 g/dL   Hematocrit 09.4 70.9 - 51.0 %   MCV 87 79 - 97 fL   MCH 28.9 26.6 - 33.0 pg   MCHC 33.3 31.5 - 35.7 g/dL   RDW 62.8 36.6 - 29.4 %   Platelets 311 150 - 450 x10E3/uL   Neutrophils 58 Not Estab. %   Lymphs 35 Not Estab. %   Monocytes 5 Not Estab. %   Eos 2 Not Estab. %   Basos 0 Not Estab. %   Neutrophils Absolute 6.9 1.4 - 7.0 x10E3/uL   Lymphocytes Absolute 4.2 (H) 0.7 - 3.1 x10E3/uL   Monocytes Absolute 0.6 0.1 - 0.9 x10E3/uL   EOS (ABSOLUTE) 0.2 0.0 - 0.4 x10E3/uL   Basophils Absolute 0.1 0.0 - 0.2 x10E3/uL   Immature Granulocytes 0 Not Estab. %   Immature Grans (Abs) 0.0 0.0 - 0.1 x10E3/uL  Lipid panel     Status: None   Collection Time: 02/18/23  8:52 AM  Result Value Ref Range   Cholesterol, Total 132 100 - 199 mg/dL   Triglycerides 55 0 - 149 mg/dL   HDL 54 >76 mg/dL   VLDL Cholesterol Cal 12 5 - 40 mg/dL   LDL Chol Calc (NIH) 66 0 - 99 mg/dL   Chol/HDL Ratio 2.4 0.0 - 5.0 ratio    Comment:  T. Chol/HDL  Ratio                                             Men  Women                               1/2 Avg.Risk  3.4    3.3                                   Avg.Risk  5.0    4.4                                2X Avg.Risk  9.6    7.1                                3X Avg.Risk 23.4   11.0   Comp Met (CMET)     Status: Abnormal   Collection Time: 04/19/23 10:35 AM  Result Value Ref Range   Glucose 108 (H) 70 - 99 mg/dL   BUN 16 8 - 27 mg/dL   Creatinine, Ser 6.29 0.76 - 1.27 mg/dL   eGFR 64 >52 WU/XLK/4.40   BUN/Creatinine Ratio 13 10 - 24   Sodium 138 134 - 144 mmol/L   Potassium 4.3 3.5 - 5.2 mmol/L   Chloride 99 96 - 106 mmol/L   CO2 20 20 - 29 mmol/L   Calcium 9.7 8.6 - 10.2 mg/dL   Total Protein 7.3 6.0 - 8.5 g/dL   Albumin 4.5 3.8 - 4.8 g/dL   Globulin, Total 2.8 1.5 - 4.5 g/dL   Bilirubin Total 1.0 0.0 - 1.2 mg/dL   Alkaline Phosphatase 112 44 - 121 IU/L   AST 43 (H) 0 - 40 IU/L   ALT 48 (H) 0 - 44 IU/L  CBC with Differential/Platelet     Status: Abnormal   Collection Time: 04/19/23 10:35 AM  Result Value Ref Range   WBC 14.6 (H) 3.4 - 10.8 x10E3/uL   RBC 5.84 (H) 4.14 - 5.80 x10E6/uL   Hemoglobin 17.2 13.0 - 17.7 g/dL   Hematocrit 10.2 72.5 - 51.0 %   MCV 86 79 - 97 fL   MCH 29.5 26.6 - 33.0 pg   MCHC 34.3 31.5 - 35.7 g/dL   RDW 36.6 44.0 - 34.7 %   Platelets 378 150 - 450 x10E3/uL   Neutrophils 62 Not Estab. %   Lymphs 32 Not Estab. %   Monocytes 4 Not Estab. %   Eos 1 Not Estab. %   Basos 0 Not Estab. %   Neutrophils Absolute 9.0 (H) 1.4 - 7.0 x10E3/uL   Lymphocytes Absolute 4.7 (H) 0.7 - 3.1 x10E3/uL   Monocytes Absolute 0.7 0.1 - 0.9 x10E3/uL   EOS (ABSOLUTE) 0.1 0.0 - 0.4 x10E3/uL   Basophils Absolute 0.1 0.0 - 0.2 x10E3/uL   Immature Granulocytes 1 Not Estab. %   Immature Grans (Abs) 0.1 0.0 - 0.1 x10E3/uL  Surgical pathology     Status: None   Collection Time: 04/29/23 12:00 AM  Result Value Ref Range   SURGICAL PATHOLOGY      Surgical Pathology CASE:  [id number] PATIENT: Keyvin Maeda Flow Pathology Report     Clinical history: lymphocytosis     DIAGNOSIS: - Abnormal B-cell population identified.  See comment.  COMMENT:  Flow cytometric analysis reveals an abnormal CD5/CD19 positive kappa restricted B-cell population (2,173 cells/microlitre) with an immunophenotype that would be consistent with chronic lymphocytic leukemia, However, the number of monotypic B-cells does not meet the 2017 WHO quantitative criteria (> 5,000 cells/microliter) for chronic lymphocytic leukemia.  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 Correlation with clinical and radiographic findings is recommended to assess for potential sites of lymphadenopathy or organomegaly.  GATING AND PHENOTYPIC ANALYSIS:  Gated population: Flow cytometric immunophenotyping is performed using antibodies to the antigens listed in the t able below. Electronic gates are placed around a cell cluster displaying light scatter properties corresponding to: lymphocytes  Abnormal Cells in gated population: 54%  Phenotype of Abnormal Cells: CD5, CD19, CD20, CD200, Kappa                      Lymphoid Antigens       Myeloid Antigens Miscellaneous CD2  NEG  CD10 NEG  CD11b     ND   CD45 POS CD3  NEG  CD19 POS  CD11c     ND   HLA-Dr    ND CD4  NEG  CD20 POS  CD13 ND   CD34 NEG CD5  POS  CD22 ND   CD14 ND   CD38 NEG CD7  NEG  CD79b     ND   CD15 ND   CD138     ND CD8  NEG  CD103     ND   CD16 ND   TdT  ND CD25 ND   CD200     POS  CD33 ND   CD123     ND TCRab     ND   sKappa    POS  CD64 ND   CD41 ND TCRgd     NEG  sLambda   NEG  CD117     ND   CD61 ND CD56 NEG  cKappa    ND   MPO  ND   CD71 ND CD57 ND   cLambda   ND        [id number]      GROSS DESCRIPTION:  One lavender top tube submitted from [facility] for lymphoma testing.    Final Diagnosis performed by Clifton James, MD.    Electronically signed 3/21/2 025 Technical and / or Professional components performed at [facility], [address] [place], [place] [address].  The above tests were developed and their performance characteristics determined by the [facility] for the physical and immunophenotypic characterization of cell populations. They have not been cleared by the U.S. Food and Drug administration. The  FDA has determined that such clearance or approval is not necessary. This test is used for clinical purposes. It should not be  regarded as investigational or for research   C-reactive protein     Status: None   Collection Time: 04/29/23  2:40 PM  Result Value Ref Range   CRP 0.5 <1.0 mg/dL    Comment: Performed at [facility], [address] [place], [place] [address]  Sedimentation rate     Status: None   Collection Time: 04/29/23  2:41 PM  Result Value Ref Range   Sed Rate 2 0 - 16 mm/hr  Comment: Performed at [facility], [address], [place], [place] [address]  Flow Cytometry, Peripheral Blood (Oncology)     Status: None   Collection Time: 04/29/23  2:41 PM  Result Value Ref Range   Flow Cytometry SEE SEPARATE REPORT     Comment: Performed at [facility], [address], [place], [place] [address]  CMP (Cancer Center only)     Status: Abnormal   Collection Time: 04/29/23  2:41 PM  Result Value Ref Range   Sodium 137 135 - 145 mmol/L   Potassium 3.9 3.5 - 5.1 mmol/L   Chloride 103 98 - 111 mmol/L   CO2 29 22 - 32 mmol/L   Glucose, Bld 102 (H) 70 - 99 mg/dL    Comment: Glucose reference range applies only to samples taken after fasting for at least 8 hours.   BUN 16 8 - 23 mg/dL   Creatinine 0.98 1.19 - 1.24 mg/dL   Calcium 9.1 8.9 - 14.7 mg/dL   Total Protein 6.7 6.5 - 8.1 g/dL   Albumin 4.0 3.5 - 5.0 g/dL   AST 23 15 - 41 U/L   ALT 26 0 - 44 U/L   Alkaline Phosphatase 76 38 - 126 U/L   Total  Bilirubin 0.9 0.0 - 1.2 mg/dL   GFR, Estimated >82 >95 mL/min    Comment: (NOTE) Calculated using the CKD-EPI Creatinine Equation (2021)    Anion gap 5 5 - 15    Comment: Performed at [facility], [address] [place], [place] [address]  CBC with Differential (Cancer Center Only)     Status: Abnormal   Collection Time: 04/29/23  2:41 PM  Result Value Ref Range   WBC Count 11.5 (H) 4.0 - 10.5 K/uL   RBC 5.49 4.22 - 5.81 MIL/uL   Hemoglobin 15.9 13.0 - 17.0 g/dL   HCT 86.5 78.4 - 69.6 %   MCV 84.9 80.0 - 100.0 fL   MCH 29.0 26.0 - 34.0 pg   MCHC 34.1 30.0 - 36.0 g/dL   RDW 29.5 28.4 - 13.2 %   Platelet Count 300 150 - 400 K/uL   nRBC 0.0 0.0 - 0.2 %   Neutrophils Relative % 59 %   Neutro Abs 6.8 1.7 - 7.7 K/uL   Lymphocytes Relative 35 %   Lymphs Abs 4.0 0.7 - 4.0 K/uL   Monocytes Relative 5 %   Monocytes Absolute 0.5 0.1 - 1.0 K/uL   Eosinophils Relative 1 %   Eosinophils Absolute 0.1 0.0 - 0.5 K/uL   Basophils Relative 0 %   Basophils Absolute 0.1 0.0 - 0.1 K/uL   Immature Granulocytes 0 %   Abs Immature Granulocytes 0.04 0.00 - 0.07 K/uL    Comment: Performed at [facility], [address], [place], [place] [address]  BCR-ABL1 FISH     Status: None   Collection Time: 04/29/23  2:42 PM  Result Value Ref Range   Specimen Type BLOOD    Cells Counted 200    Cells Analyzed 200    FISH Result ABL1 GENE FUSION     Comment: Comment: NO BCR    Interpretation Comment:     Comment: (NOTE) NEGATIVE             nuc ish 9q34(ASS1,ABL1)x2,22q11.2(BCRx2)[200].      The fluorescence in situ hybridization (FISH) study was normal. FISH, using unique sequence DNA probes for the ABL1 and BCR gene regions  showed two ABL1 signals (red), two control ASS1 gene signals (aqua) located adjacent to the ABL1 locus at 9q34, and two BCR signals (green) at 22q11.2 in all interphase nuclei examined. There was NO evidence of CML or  ALL-associated BCR::ABL1 dual fusion signals in this analysis. .      This analysis is limited to abnormalities detectable by the specific probes included in the study. FISH results should be interpreted within the context of a full cytogenetic analysis and pathology evaluation.  A BCR::ABL1 gene fusion in greater than 3 interphase nuclei in a patient with a new clinical diagnosis is considered positive. The DNA probe vendor for this study was [employer], community). .      This test was developed and its performance characteristics d etermined by [employer] ([employer]). It has not been cleared or approved by the U.S. Food and Drug Administration.    Director Review: Comment:     Comment: (NOTE) Reino Bellis, PhD, [facility] Performed At: [facility] [address] [place], [place] [id number] Maurine Simmering MDPhD NW:[id number]   Lactate dehydrogenase     Status: None   Collection Time: 04/29/23  2:43 PM  Result Value Ref Range   LDH 162 98 - 192 U/L    Comment: Performed at [facility], [address], [place], [place] [address]     RADIOGRAPHIC STUDIES:  No recent pertinent imaging studies available to review.  Orders Placed This Encounter  Procedures   CBC with Differential (Cancer Center Only)    Standing Status:   Future    Expected Date:   07/30/2023    Expiration Date:   05/13/2024   CMP (Cancer Center only)    Standing Status:   Future    Expected Date:   07/30/2023    Expiration Date:   05/13/2024   Lactate dehydrogenase    Standing Status:   Future    Expected Date:   07/30/2023    Expiration Date:   05/13/2024     Future Appointments  Date Time Provider Department Center  07/21/2023 11:00 AM [facility] [place] [facility] None  07/27/2023  1:30 PM Sandre Kitty, MD [facility] None  07/30/2023  2:00 PM [facility] None  07/30/2023  2:30 PM Aalaysia Liggins, MD [facility] None   11/11/2023  1:40 PM [facility]-ANNUAL WELLNESS VISIT [facility] None    This document was completed utilizing speech recognition software. Grammatical errors, random word insertions, pronoun errors, and incomplete sentences are an occasional consequence of this system due to software limitations, ambient noise, and hardware issues. Any formal questions or concerns about the content, text or information contained within the body of this dictation should be directly addressed to the provider for clarification.

## 2023-05-14 NOTE — Assessment & Plan Note (Signed)
 His white blood cell count has been slightly elevated, ranging from 12,000 to 16,000, since 2018, with a specific increase in lymphocytes recently.    Absolute lymphocyte count was 4500 in September 2024, 4200 on 02/18/2023 and 4700 on 04/19/2023.  Otherwise normal differential.  Hemoglobin and platelet count have been within normal limits.    On his consultation with [place] on 04/29/2023, labs actually showed improved white count of 11,500 with normal lymphocyte count of 4000, normal differential.  Hemoglobin and platelet count were within normal limits.  CMP unremarkable.  ESR, CRP, LDH were within normal limits.  BCR/ABL 1 was negative.  Flow cytometry of peripheral blood revealed an abnormal CD5/CD19 positive kappa restricted B-cell population (2,173 cells/microlitre) with an immunophenotype that would be consistent with chronic lymphocytic  leukemia, However, the number of monotypic B-cells does not meet the 2017 WHO quantitative criteria (> 5,000 cells/microliter) for chronic lymphocytic leukemia.  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  No hematological intervention would be needed apart from close monitoring.  He is asymptomatic, with no recurrent infections, fevers, night sweats, weight loss, or lymphadenopathy or other constitutional symptoms.  Monitor blood counts every three months initially, then every six months if stable.

## 2023-07-19 ENCOUNTER — Other Ambulatory Visit: Payer: Self-pay | Admitting: Family Medicine

## 2023-07-19 DIAGNOSIS — I10 Essential (primary) hypertension: Secondary | ICD-10-CM

## 2023-07-19 DIAGNOSIS — E782 Mixed hyperlipidemia: Secondary | ICD-10-CM

## 2023-07-19 MED ORDER — AMLODIPINE BESYLATE 10 MG PO TABS: 10.0000 mg | ORAL_TABLET | Freq: Every day | ORAL | 1 refills | Status: AC

## 2023-07-19 MED ORDER — ATORVASTATIN CALCIUM 80 MG PO TABS: 80.0000 mg | ORAL_TABLET | Freq: Every day | ORAL | 1 refills | Status: DC

## 2023-07-19 NOTE — Telephone Encounter (Signed)
 Copied from CRM [id number]. Topic: Clinical - Medication Refill >> Jul 19, 2023  9:36 AM Zipporah Him wrote: Medication: amLODipine  (NORVASC ) 10 MG tablet, atorvastatin  (LIPITOR) 80 MG tablet  Has the patient contacted their pharmacy? Yes   This is the patient's preferred pharmacy:  [facility] - [place], [place] - [address] [place] [address] Phone: [phone] Fax: [phone]  Is this the correct pharmacy for this prescription? Yes If no, delete pharmacy and type the correct one.   Has the prescription been filled recently? No  Is the patient out of the medication? No, a couple days left  Has the patient been seen for an appointment in the last year OR does the patient have an upcoming appointment? Yes  Can we respond through MyChart? Yes  Agent: Please be advised that Rx refills may take up to 3 business days. We ask that you follow-up with your pharmacy.

## 2023-07-21 ENCOUNTER — Other Ambulatory Visit

## 2023-07-21 DIAGNOSIS — I10 Essential (primary) hypertension: Secondary | ICD-10-CM

## 2023-07-21 DIAGNOSIS — D72820 Lymphocytosis (symptomatic): Secondary | ICD-10-CM

## 2023-07-21 DIAGNOSIS — R7989 Other specified abnormal findings of blood chemistry: Secondary | ICD-10-CM

## 2023-07-21 DIAGNOSIS — E782 Mixed hyperlipidemia: Secondary | ICD-10-CM

## 2023-07-22 ENCOUNTER — Ambulatory Visit: Payer: Self-pay

## 2023-07-22 ENCOUNTER — Ambulatory Visit: Payer: Self-pay | Admitting: Family Medicine

## 2023-07-22 NOTE — Telephone Encounter (Signed)
 Patient has had vertigo and has been taking meclizine , he wants to let PCP know if that could have affected his lab work. Patient states he would like a call back in regards to the PCP's response.   Copied from CRM [id number]. Topic: Clinical - Lab/Test Results >> Jul 22, 2023  2:09 PM Donee H wrote: Reason for CRM: Patient wants to go over recent lab results. Patient states haven't been taking any new medications and was previously speaking to someone regarding results. Reason for Disposition  [1] Follow-up call from patient regarding patient's clinical status AND [2] information NON-URGENT  Answer Assessment - Initial Assessment Questions 1. REASON FOR CALL or QUESTION: What is your reason for calling today? or How can I best help you? or What question do you have that I can help answer?     Patient states he misspoke earlier to the medical assistant. He states he had had a vertigo spell for the past few days and has been taking meclizine . He states he took at least 2 doses before having his blood drawn.  2. CALLER: Document the source of call. (e.g., laboratory, patient).     Patient.  Patient denies changes in speech or vision, pain, unilateral numbness or weakness. He states his whole body is weak, but states he is able to get up and walk around.  Protocols used: PCP Call - No Triage-A-AH

## 2023-07-22 NOTE — Telephone Encounter (Signed)
 Copied from CRM [id number]. Topic: Clinical - Lab/Test Results >> Jul 22, 2023  2:09 PM Donee H wrote: Reason for CRM: Patient wants to go over recent lab results. Patient states haven't been taking any new medications and was previously speaking to someone regarding results.

## 2023-07-23 NOTE — Telephone Encounter (Signed)
 Ok I just needed to know if anything changed or if I need to have him stop any medications before he sees me Tuesday.  We can discuss his lab results when he sees me in a few days.

## 2023-07-23 NOTE — Telephone Encounter (Signed)
 Ok, I don't think the meclizine  is what caused the change in his lab results.

## 2023-07-26 LAB — COMPREHENSIVE METABOLIC PANEL WITH GFR
ALT: 197 IU/L — ABNORMAL HIGH (ref 0–44)
AST: 102 IU/L — ABNORMAL HIGH (ref 0–40)
Albumin: 3.9 g/dL (ref 3.8–4.8)
Alkaline Phosphatase: 189 IU/L — ABNORMAL HIGH (ref 44–121)
BUN/Creatinine Ratio: 13 (ref 10–24)
BUN: 15 mg/dL (ref 8–27)
Bilirubin Total: 2 mg/dL — ABNORMAL HIGH (ref 0.0–1.2)
CO2: 17 mmol/L — ABNORMAL LOW (ref 20–29)
Calcium: 9.2 mg/dL (ref 8.6–10.2)
Chloride: 98 mmol/L (ref 96–106)
Creatinine, Ser: 1.2 mg/dL (ref 0.76–1.27)
Globulin, Total: 2.7 g/dL (ref 1.5–4.5)
Glucose: 101 mg/dL — ABNORMAL HIGH (ref 70–99)
Potassium: 3.7 mmol/L (ref 3.5–5.2)
Sodium: 136 mmol/L (ref 134–144)
Total Protein: 6.6 g/dL (ref 6.0–8.5)
eGFR: 64 mL/min/{1.73_m2} (ref >59)

## 2023-07-26 LAB — CBC WITH DIFFERENTIAL/PLATELET
Basophils Absolute: 0 10*3/uL (ref 0.0–0.2)
Basos: 0 %
EOS (ABSOLUTE): 0.1 10*3/uL (ref 0.0–0.4)
Eos: 0 %
Hematocrit: 46.6 % (ref 37.5–51.0)
Hemoglobin: 15.4 g/dL (ref 13.0–17.7)
Immature Grans (Abs): 0 10*3/uL (ref 0.0–0.1)
Immature Granulocytes: 0 %
Lymphocytes Absolute: 4.2 10*3/uL — ABNORMAL HIGH (ref 0.7–3.1)
Lymphs: 24 %
MCH: 28.3 pg (ref 26.6–33.0)
MCHC: 33 g/dL (ref 31.5–35.7)
MCV: 86 fL (ref 79–97)
Monocytes Absolute: 0.9 10*3/uL (ref 0.1–0.9)
Monocytes: 5 %
Neutrophils Absolute: 12 10*3/uL — ABNORMAL HIGH (ref 1.4–7.0)
Neutrophils: 71 %
Platelets: 295 10*3/uL (ref 150–450)
RBC: 5.45 x10E6/uL (ref 4.14–5.80)
RDW: 13.2 % (ref 11.6–15.4)
WBC: 17.2 10*3/uL — ABNORMAL HIGH (ref 3.4–10.8)

## 2023-07-26 LAB — LIPID PANEL
Chol/HDL Ratio: 2.3 ratio (ref 0.0–5.0)
Cholesterol, Total: 104 mg/dL (ref 100–199)
HDL: 45 mg/dL (ref >39)
LDL Chol Calc (NIH): 42 mg/dL (ref 0–99)
Triglycerides: 86 mg/dL (ref 0–149)
VLDL Cholesterol Cal: 17 mg/dL (ref 5–40)

## 2023-07-26 LAB — ALDOSTERONE + RENIN ACTIVITY W/ RATIO
Aldos/Renin Ratio: <1.3 (ref 0.0–30.0)
Aldosterone: <1 ng/dL (ref 0.0–30.0)
Renin Activity, Plasma: 0.751 ng/mL/h (ref 0.167–5.380)

## 2023-07-27 ENCOUNTER — Ambulatory Visit: Admitting: Family Medicine

## 2023-07-30 ENCOUNTER — Ambulatory Visit: Admitting: Oncology

## 2023-07-30 ENCOUNTER — Other Ambulatory Visit

## 2023-08-20 ENCOUNTER — Inpatient Hospital Stay: Admitting: Oncology

## 2023-08-20 ENCOUNTER — Inpatient Hospital Stay

## 2023-08-20 ENCOUNTER — Other Ambulatory Visit: Payer: Self-pay | Admitting: Oncology

## 2023-08-20 DIAGNOSIS — D72820 Lymphocytosis (symptomatic): Secondary | ICD-10-CM

## 2023-10-04 ENCOUNTER — Encounter: Payer: Self-pay | Admitting: Ophthalmology

## 2023-10-19 ENCOUNTER — Encounter: Payer: Self-pay | Admitting: Family Medicine

## 2023-10-19 ENCOUNTER — Ambulatory Visit (INDEPENDENT_AMBULATORY_CARE_PROVIDER_SITE_OTHER): Admitting: Family Medicine

## 2023-10-19 VITALS — BP 117/72 | HR 83 | Ht 71.0 in | Wt 170.4 lb

## 2023-10-19 DIAGNOSIS — R7989 Other specified abnormal findings of blood chemistry: Secondary | ICD-10-CM

## 2023-10-19 DIAGNOSIS — H8190 Unspecified disorder of vestibular function, unspecified ear: Secondary | ICD-10-CM | POA: Diagnosis not present

## 2023-10-19 DIAGNOSIS — R17 Unspecified jaundice: Secondary | ICD-10-CM | POA: Diagnosis not present

## 2023-10-19 DIAGNOSIS — I10 Essential (primary) hypertension: Secondary | ICD-10-CM | POA: Diagnosis not present

## 2023-10-19 DIAGNOSIS — D72820 Lymphocytosis (symptomatic): Secondary | ICD-10-CM | POA: Diagnosis not present

## 2023-10-19 DIAGNOSIS — R748 Abnormal levels of other serum enzymes: Secondary | ICD-10-CM

## 2023-10-19 NOTE — Progress Notes (Unsigned)
 Established Patient Office Visit  Subjective   Patient ID: William Fuhs., male    DOB: [birth year]  Age: 73 y.o. MRN: [id number]  Chief Complaint  Patient presents with   Medical Management of Chronic Issues    HPI  Friday vertigo  [facility]  Subjective - Recurrent episodes of vertigo. Last episode was on Friday, 10/15/2023. Now resolved as of Tuesday, 10/19/2023. - Reports a few episodes since the last visit. Episodes are sudden in onset, severe, and debilitating, causing inability to move or get out of bed for a full day. An episode lasts approximately 8 hours before improvement is noted. - Associated symptoms with episodes include nausea, vomiting, and diarrhea. Improvement is judged by the ability to get out of bed. - Has had multiple ED visits and hospitalizations for these spells, diagnosed as vertigo. - Reports a friend was referred to a vertigo specialist in [place] who advised discontinuing medication. Patient is considering stopping meclizine  as it does not seem to provide relief. - Reports right-sided pain for the last 8 days, which was sensitive and limited standing. This pain is now improving and is not exacerbated by deep inspiration. - History of cataract surgery with subsequent vision issues for 18 months, managed by Dr. Lynwood Brick at [facility] in [place] and a specialist at [facility]. Current glasses are new as of last Tuesday. Experiences occasional color changes or blurriness with the new glasses.  Medications Meclizine  prn for vertigo, lisinopril , bisoprolol , atorvastatin  (Lipitor), and amlodipine . Patient is considering stopping meclizine  due to lack of efficacy. Advised to continue other prescribed medications.  PMH, PSH, FH, Social Hx PMHx: Vertigo, elevated white blood cell count (discussed with hematologist), hearing loss, cataract (s/p surgery). PSH: Cataract surgery. Social Hx: Wife had a major stroke, is non-verbal,  non-ambulatory, and was on a feeding tube. She is now in hospice care but is reportedly improving. 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 Hearing loss dates back to the 1970s from jet noise exposure during [employer].  ROS HEENT: Positive for vertigo. Negative for worsening hearing or ear pain with dizzy spells. Reports chronic hearing loss, worse in one ear. Denies current dizziness. GI: Positive for nausea, vomiting, and diarrhea during vertigo episodes. MSK: Reports resolving right-sided pain.  Objective General: No acute distress. HEENT: Extraocular movements are intact. No nystagmus. PSYCH: Affect is appropriate.  Assessment and Plan Vertigo, recurrent - Patient presents for follow-up of recurrent, debilitating vertiginous episodes. The last episode was on 10/15/2023. Episodes are sudden, last about 8 hours, and are associated with nausea, vomiting, and diarrhea. Meclizine  provides no significant symptom relief. Exam today is unremarkable for nystagmus. - Continue to hold meclizine . - Discussed referral to a physical therapist specializing in vertigo; will await specific provider information from the patient. - Ordered labs (CBC) to recheck leukocytosis and to evaluate for other causes of vertigo.   The ASCVD Risk score (Arnett DK, et al., 2019) failed to calculate for the following reasons:   The valid total cholesterol range is 130 to 320 mg/dL  Health Maintenance Due  Topic Date Due   DTaP/Tdap/Td (1 - Tdap) Never done   Colonoscopy  Never done   Zoster Vaccines- Shingrix (1 of 2) Never done   Pneumococcal Vaccine: 50+ Years (2 of 2 - PCV20 or PCV21) 09/30/2018   Influenza Vaccine  09/10/2023   COVID-19 Vaccine (3 - 2025-26 season) 10/11/2023   Medicare Annual Wellness (AWV)  11/10/2023  Objective:     BP 117/72   Pulse 83   Ht 5' 11 (1.803 m)   Wt 170 lb 6.4 oz (77.3 kg)   SpO2 99%   BMI 23.77 kg/m  {Vitals History  (Optional):23777}  Physical Exam   No results found for any visits on 10/19/23.      Assessment & Plan:   There are no diagnoses linked to this encounter.   No follow-ups on file.    Toribio MARLA Slain, MD

## 2023-10-19 NOTE — Patient Instructions (Signed)
 It was nice to see you today,  We addressed the following topics today: -I am ordering some more tests regarding your other previous abnormal blood test.  I will discuss the results with you when I get them - You should stop taking the meclizine , especially if it is not helping the vertigo.   Have a great day,  Rolan Slain, MD

## 2023-10-20 LAB — CBC WITH DIFFERENTIAL/PLATELET
Basophils Absolute: 0 x10E3/uL (ref 0.0–0.2)
Basos: 0 %
EOS (ABSOLUTE): 0.1 x10E3/uL (ref 0.0–0.4)
Eos: 1 %
Hematocrit: 47.5 % (ref 37.5–51.0)
Hemoglobin: 15.6 g/dL (ref 13.0–17.7)
Immature Grans (Abs): 0.1 x10E3/uL (ref 0.0–0.1)
Immature Granulocytes: 1 %
Lymphocytes Absolute: 4.2 x10E3/uL — ABNORMAL HIGH (ref 0.7–3.1)
Lymphs: 32 %
MCH: 28.5 pg (ref 26.6–33.0)
MCHC: 32.8 g/dL (ref 31.5–35.7)
MCV: 87 fL (ref 79–97)
Monocytes Absolute: 0.6 x10E3/uL (ref 0.1–0.9)
Monocytes: 5 %
Neutrophils Absolute: 8 x10E3/uL — ABNORMAL HIGH (ref 1.4–7.0)
Neutrophils: 61 %
Platelets: 320 x10E3/uL (ref 150–450)
RBC: 5.48 x10E6/uL (ref 4.14–5.80)
RDW: 12.9 % (ref 11.6–15.4)
WBC: 13 x10E3/uL — ABNORMAL HIGH (ref 3.4–10.8)

## 2023-10-20 LAB — BILIRUBIN, DIRECT: Bilirubin, Direct: 0.24 mg/dL (ref 0.00–0.40)

## 2023-10-20 LAB — COMPREHENSIVE METABOLIC PANEL WITH GFR
ALT: 142 IU/L — ABNORMAL HIGH (ref 0–44)
AST: 36 IU/L (ref 0–40)
Albumin: 4 g/dL (ref 3.8–4.8)
Alkaline Phosphatase: 129 IU/L — ABNORMAL HIGH (ref 44–121)
BUN/Creatinine Ratio: 15 (ref 10–24)
BUN: 20 mg/dL (ref 8–27)
Bilirubin Total: 0.7 mg/dL (ref 0.0–1.2)
CO2: 20 mmol/L (ref 20–29)
Calcium: 9.2 mg/dL (ref 8.6–10.2)
Chloride: 99 mmol/L (ref 96–106)
Creatinine, Ser: 1.32 mg/dL — ABNORMAL HIGH (ref 0.76–1.27)
Globulin, Total: 2.5 g/dL (ref 1.5–4.5)
Glucose: 154 mg/dL — ABNORMAL HIGH (ref 70–99)
Potassium: 4.1 mmol/L (ref 3.5–5.2)
Sodium: 135 mmol/L (ref 134–144)
Total Protein: 6.5 g/dL (ref 6.0–8.5)
eGFR: 57 mL/min/1.73 — ABNORMAL LOW (ref >59)

## 2023-10-20 LAB — GAMMA GT: GGT: 67 IU/L — ABNORMAL HIGH (ref 0–65)

## 2023-10-20 LAB — LACTATE DEHYDROGENASE: LDH: 150 IU/L (ref 121–224)

## 2023-10-21 ENCOUNTER — Ambulatory Visit: Payer: Self-pay | Admitting: Family Medicine

## 2023-10-21 ENCOUNTER — Other Ambulatory Visit: Payer: Self-pay | Admitting: Family Medicine

## 2023-10-21 DIAGNOSIS — R748 Abnormal levels of other serum enzymes: Secondary | ICD-10-CM

## 2023-10-21 DIAGNOSIS — R7401 Elevation of levels of liver transaminase levels: Secondary | ICD-10-CM

## 2023-10-22 NOTE — Assessment & Plan Note (Addendum)
-   Patient presents for follow-up of recurrent, debilitating vertiginous episodes. The last episode was on 10/15/2023,now resolved. Episodes are sudden, last about 8 hours, and are associated with nausea, vomiting, and diarrhea. Meclizine  provides no significant symptom relief. Exam today is unremarkable for nystagmus. - Continue to hold meclizine .

## 2023-10-25 ENCOUNTER — Other Ambulatory Visit

## 2023-10-25 ENCOUNTER — Telehealth: Payer: Self-pay | Admitting: *Deleted

## 2023-10-25 DIAGNOSIS — R748 Abnormal levels of other serum enzymes: Secondary | ICD-10-CM

## 2023-10-25 DIAGNOSIS — R7401 Elevation of levels of liver transaminase levels: Secondary | ICD-10-CM

## 2023-10-25 NOTE — Telephone Encounter (Unsigned)
 Pt came in office with some information about a referral.  He said he eventually would like a referral to [facility] in [place] with Delon Ned for PT for Vertigo.  He will let us  know when he is read

## 2023-10-26 LAB — ANCA PROFILE
Anti-MPO Antibodies: <0.2 U (ref 0.0–0.9)
Anti-PR3 Antibodies: <0.2 U (ref 0.0–0.9)
Atypical pANCA: <1:20 {titer}
C-ANCA: <1:20 {titer}
P-ANCA: <1:20 {titer}

## 2023-10-26 LAB — MITOCHONDRIAL ANTIBODIES: Mitochondrial Ab: <20 U (ref 0.0–20.0)

## 2023-10-27 ENCOUNTER — Ambulatory Visit: Payer: Self-pay | Admitting: Family Medicine

## 2023-10-28 ENCOUNTER — Ambulatory Visit
Admission: RE | Admit: 2023-10-28 | Discharge: 2023-10-28 | Disposition: A | Discharge status: Home / Self Care | Source: Ambulatory Visit | Attending: Family Medicine | Admitting: Family Medicine

## 2023-10-28 DIAGNOSIS — R7401 Elevation of levels of liver transaminase levels: Secondary | ICD-10-CM

## 2023-10-28 DIAGNOSIS — R748 Abnormal levels of other serum enzymes: Secondary | ICD-10-CM

## 2023-11-01 NOTE — Progress Notes (Signed)
 Called pt on both numbers no answer if patient contacts the office please advised

## 2023-11-02 NOTE — Progress Notes (Signed)
 Called pt he stated that he will contact the office after he talk it over with his daughter

## 2023-11-22 ENCOUNTER — Encounter: Payer: Self-pay | Admitting: Family Medicine

## 2023-11-22 ENCOUNTER — Ambulatory Visit (INDEPENDENT_AMBULATORY_CARE_PROVIDER_SITE_OTHER): Admitting: Family Medicine

## 2023-11-22 VITALS — BP 102/64 | HR 76 | Ht 71.0 in | Wt 167.8 lb

## 2023-11-22 DIAGNOSIS — D72829 Elevated white blood cell count, unspecified: Secondary | ICD-10-CM | POA: Diagnosis not present

## 2023-11-22 DIAGNOSIS — E782 Mixed hyperlipidemia: Secondary | ICD-10-CM | POA: Diagnosis not present

## 2023-11-22 DIAGNOSIS — Z9049 Acquired absence of other specified parts of digestive tract: Secondary | ICD-10-CM | POA: Diagnosis not present

## 2023-11-22 DIAGNOSIS — R3129 Other microscopic hematuria: Secondary | ICD-10-CM | POA: Diagnosis not present

## 2023-11-22 NOTE — Assessment & Plan Note (Signed)
 Recovering well from recent robotic cholecystectomy for what was presumed to be symptomatic cholelithiasis/sludge causing elevated LFTs. Reports resolution of pain. Denies nausea, vomiting, or changes in bowel habits. Exam shows well-healing incisions. - Rechecking labs today to ensure LFTs are trending down. - Continue holding Lipitor for now. - Follow up with surgeon next week as scheduled. - Follow up here in December to review labs and discuss restarting Lipitor.

## 2023-11-22 NOTE — Progress Notes (Unsigned)
   Established Patient Office Visit  Subjective   Patient ID: William Hogan., male    DOB: 12-08-1950  Age: 73 y.o. MRN: [id number]  Chief Complaint  Patient presents with   Hospitalization Follow-up    HPI  Subjective - Follow-up post-cholecystectomy. Discharged from hospital on 11/14/2023. Reports feeling tired post-operatively, but sleep is improving. Now sleeping approximately 9-10 hours per night, waking once. Denies daytime fatigue. Pain has resolved. Reports no other issues or concerns. - Wife is in a nursing home, and he is her caregiver. Currently taking a break from caregiving duties to focus on his own recovery.  Medications Medications: blood pressure medications (unspecified), Lipitor was held by the hospital due to elevated liver enzymes. No other changes reported. Took Tylenol  and Tums once since surgery.  PMH, PSH, FH, Social Hx PMHx: Elevated liver enzymes (thought to be secondary to gallbladder sludge), hyperlipidemia. PSH: Cholecystectomy (robotic), early October 2025. 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  ROS No nausea, vomiting, or constipation.  Sleep disturbance Reports initial post-operative fatigue and trouble sleeping, which has now resolved. Currently sleeping 9-10 hours per night with one nocturnal awakening. Denies daytime somnolence. - No intervention needed at this time. Reassurance provided.   The ASCVD Risk score (Arnett DK, et al., 2019) failed to calculate for the following reasons:   The valid total cholesterol range is 130 to 320 mg/dL  Health Maintenance Due  Topic Date Due   DTaP/Tdap/Td (1 - Tdap) Never done   Colonoscopy  Never done   Zoster Vaccines- Shingrix (1 of 2) Never done   Pneumococcal Vaccine: 50+ Years (2 of 2 - PCV20 or PCV21) 09/30/2018   Influenza Vaccine  09/10/2023   COVID-19 Vaccine (3 - 2025-26 season) 10/11/2023   Medicare Annual Wellness (AWV)  11/10/2023      Objective:      BP 102/64   Pulse 76   Ht 5' 11 (1.803 m)   Wt 167 lb 12.8 oz (76.1 kg)   SpO2 97%   BMI 23.40 kg/m  {Vitals History (Optional):23777}  Physical Exam Gen: alert, oriented Pulm: no respiratory distress Abdomen: Surgical incisions from cholecystectomy are clean, dry, and intact. Some resolving bruising noted. No signs of infection. Psych: pleasant affect   No results found for any visits on 11/22/23.      Assessment & Plan:   S/P cholecystectomy Assessment & Plan: Recovering well from recent robotic cholecystectomy for what was presumed to be symptomatic cholelithiasis/sludge causing elevated LFTs. Reports resolution of pain. Denies nausea, vomiting, or changes in bowel habits. Exam shows well-healing incisions. - Rechecking labs today to ensure LFTs are trending down. - Continue holding Lipitor for now. - Follow up with surgeon next week as scheduled. - Follow up here in December to review labs and discuss restarting Lipitor.   Mixed hyperlipidemia -     Comprehensive metabolic panel with GFR -     Lipid panel  Leukocytosis, unspecified type -     CBC with Differential/Platelet  Microscopic hematuria Assessment & Plan: Hospital reported hematuria and a thickened bladder wall. A referral was made to urology. - Has appointment with urology on 02/09/2024. - Advised to call the urology office to reschedule if the appointment time is not convenient. Provided with their phone number.      Return for already on file.    Toribio MARLA Slain, MD

## 2023-11-22 NOTE — Patient Instructions (Addendum)
 It was nice to see you today,  We addressed the following topics today: -Everything looks good.  We are rechecking your labs. - Before I restart your Lipitor I will recheck your liver enzyme tests.  At your next visit we can discuss if restarting it is appropriate. - You have an appointment on December 31 with the urologist.  I would call them at the following number to reschedule it if you do not think you can make that appointment.:  [phone]  Have a great day,  Rolan Slain, MD

## 2023-11-22 NOTE — Assessment & Plan Note (Signed)
 Hospital reported hematuria and a thickened bladder wall. A referral was made to urology. - Has appointment with urology on 02/09/2024. - Advised to call the urology office to reschedule if the appointment time is not convenient. Provided with their phone number.

## 2023-11-23 LAB — COMPREHENSIVE METABOLIC PANEL WITH GFR
ALT: 33 IU/L (ref 0–44)
AST: 28 IU/L (ref 0–40)
Albumin: 4 g/dL (ref 3.8–4.8)
Alkaline Phosphatase: 104 IU/L (ref 47–123)
BUN/Creatinine Ratio: 12 (ref 10–24)
BUN: 15 mg/dL (ref 8–27)
Bilirubin Total: 0.7 mg/dL (ref 0.0–1.2)
CO2: 17 mmol/L — ABNORMAL LOW (ref 20–29)
Calcium: 9.3 mg/dL (ref 8.6–10.2)
Chloride: 99 mmol/L (ref 96–106)
Creatinine, Ser: 1.27 mg/dL (ref 0.76–1.27)
Globulin, Total: 2.5 g/dL (ref 1.5–4.5)
Glucose: 211 mg/dL — ABNORMAL HIGH (ref 70–99)
Potassium: 4.5 mmol/L (ref 3.5–5.2)
Sodium: 137 mmol/L (ref 134–144)
Total Protein: 6.5 g/dL (ref 6.0–8.5)
eGFR: 59 mL/min/1.73 — ABNORMAL LOW (ref >59)

## 2023-11-23 LAB — LIPID PANEL
Chol/HDL Ratio: 3.4 ratio (ref 0.0–5.0)
Cholesterol, Total: 173 mg/dL (ref 100–199)
HDL: 51 mg/dL (ref >39)
LDL Chol Calc (NIH): 105 mg/dL — ABNORMAL HIGH (ref 0–99)
Triglycerides: 95 mg/dL (ref 0–149)
VLDL Cholesterol Cal: 17 mg/dL (ref 5–40)

## 2023-11-23 LAB — CBC WITH DIFFERENTIAL/PLATELET
Basophils Absolute: 0.1 x10E3/uL (ref 0.0–0.2)
Basos: 0 %
EOS (ABSOLUTE): 0.1 x10E3/uL (ref 0.0–0.4)
Eos: 1 %
Hematocrit: 45.8 % (ref 37.5–51.0)
Hemoglobin: 15.2 g/dL (ref 13.0–17.7)
Immature Grans (Abs): 0.1 x10E3/uL (ref 0.0–0.1)
Immature Granulocytes: 1 %
Lymphocytes Absolute: 4.5 x10E3/uL — ABNORMAL HIGH (ref 0.7–3.1)
Lymphs: 33 %
MCH: 28.8 pg (ref 26.6–33.0)
MCHC: 33.2 g/dL (ref 31.5–35.7)
MCV: 87 fL (ref 79–97)
Monocytes Absolute: 0.5 x10E3/uL (ref 0.1–0.9)
Monocytes: 4 %
Neutrophils Absolute: 8.4 x10E3/uL — ABNORMAL HIGH (ref 1.4–7.0)
Neutrophils: 61 %
Platelets: 463 x10E3/uL — ABNORMAL HIGH (ref 150–450)
RBC: 5.28 x10E6/uL (ref 4.14–5.80)
RDW: 13.9 % (ref 11.6–15.4)
WBC: 13.6 x10E3/uL — ABNORMAL HIGH (ref 3.4–10.8)

## 2023-11-24 ENCOUNTER — Ambulatory Visit: Payer: Self-pay | Admitting: Family Medicine

## 2023-11-24 DIAGNOSIS — E782 Mixed hyperlipidemia: Secondary | ICD-10-CM

## 2023-11-24 DIAGNOSIS — R7303 Prediabetes: Secondary | ICD-10-CM

## 2023-11-24 NOTE — Telephone Encounter (Signed)
 Pt is coming in 01/13/24 for recheck Lipid panel.

## 2023-12-21 ENCOUNTER — Other Ambulatory Visit: Payer: Self-pay | Admitting: Family Medicine

## 2023-12-21 DIAGNOSIS — I10 Essential (primary) hypertension: Secondary | ICD-10-CM

## 2023-12-24 ENCOUNTER — Telehealth: Payer: Self-pay

## 2023-12-24 NOTE — Telephone Encounter (Signed)
 Copied from CRM #[id number]. Topic: Referral - Request for Referral >> Dec 24, 2023 11:48 AM Charlet HERO wrote: Did the patient discuss referral with their provider in the last year? Yes  Appointment offered? No  Type of order/referral and detailed reason for visit: Physical Theraphy   Preference of office, provider, location: [facility]  If referral order, have you been seen by this specialty before? No   Can we respond through MyChart? No

## 2023-12-29 NOTE — Telephone Encounter (Signed)
 Can you call pt to confirm that this is for his vertigo?

## 2024-01-13 ENCOUNTER — Other Ambulatory Visit

## 2024-01-13 DIAGNOSIS — R7303 Prediabetes: Secondary | ICD-10-CM

## 2024-01-13 DIAGNOSIS — E782 Mixed hyperlipidemia: Secondary | ICD-10-CM

## 2024-01-14 ENCOUNTER — Ambulatory Visit: Payer: Self-pay | Admitting: Family Medicine

## 2024-01-14 LAB — COMPREHENSIVE METABOLIC PANEL WITH GFR
ALT: 23 IU/L (ref 0–44)
AST: 20 IU/L (ref 0–40)
Albumin: 3.9 g/dL (ref 3.8–4.8)
Alkaline Phosphatase: 79 IU/L (ref 47–123)
BUN/Creatinine Ratio: 14 (ref 10–24)
BUN: 17 mg/dL (ref 8–27)
Bilirubin Total: 0.7 mg/dL (ref 0.0–1.2)
CO2: 24 mmol/L (ref 20–29)
Calcium: 9 mg/dL (ref 8.6–10.2)
Chloride: 102 mmol/L (ref 96–106)
Creatinine, Ser: 1.21 mg/dL (ref 0.76–1.27)
Globulin, Total: 2.2 g/dL (ref 1.5–4.5)
Glucose: 105 mg/dL — ABNORMAL HIGH (ref 70–99)
Potassium: 3.8 mmol/L (ref 3.5–5.2)
Sodium: 138 mmol/L (ref 134–144)
Total Protein: 6.1 g/dL (ref 6.0–8.5)
eGFR: 63 mL/min/1.73

## 2024-01-14 LAB — LIPID PANEL
Chol/HDL Ratio: 2.6 ratio (ref 0.0–5.0)
Cholesterol, Total: 142 mg/dL (ref 100–199)
HDL: 55 mg/dL (ref >39)
LDL Chol Calc (NIH): 75 mg/dL (ref 0–99)
Triglycerides: 59 mg/dL (ref 0–149)
VLDL Cholesterol Cal: 12 mg/dL (ref 5–40)

## 2024-01-14 LAB — HEMOGLOBIN A1C
Est. average glucose Bld gHb Est-mCnc: 97 mg/dL
Hgb A1c MFr Bld: 5 % (ref 4.8–5.6)

## 2024-01-17 ENCOUNTER — Other Ambulatory Visit: Payer: Self-pay | Admitting: Family Medicine

## 2024-01-17 DIAGNOSIS — I10 Essential (primary) hypertension: Secondary | ICD-10-CM

## 2024-01-20 ENCOUNTER — Encounter: Payer: Self-pay | Admitting: Family Medicine

## 2024-01-20 ENCOUNTER — Ambulatory Visit: Admitting: Family Medicine

## 2024-01-20 VITALS — BP 112/67 | HR 90 | Ht 71.0 in | Wt 173.0 lb

## 2024-01-20 DIAGNOSIS — R7303 Prediabetes: Secondary | ICD-10-CM | POA: Diagnosis not present

## 2024-01-20 DIAGNOSIS — I10 Essential (primary) hypertension: Secondary | ICD-10-CM | POA: Diagnosis not present

## 2024-01-20 DIAGNOSIS — R5381 Other malaise: Secondary | ICD-10-CM | POA: Insufficient documentation

## 2024-01-20 DIAGNOSIS — E782 Mixed hyperlipidemia: Secondary | ICD-10-CM | POA: Diagnosis not present

## 2024-01-20 MED ORDER — ATORVASTATIN CALCIUM 40 MG PO TABS: 40.0000 mg | ORAL_TABLET | Freq: Every day | ORAL | 3 refills | Status: AC

## 2024-01-20 NOTE — Assessment & Plan Note (Signed)
 Difficulty with transfers / Musculoskeletal deconditioning Reports difficulty rising from a seated position, most prominent in the morning from bed. This is not associated with dizziness or vertigo. Exam is benign. This likely represents some deconditioning. Discussed physical therapy for strengthening. A referral for outpatient PT was previously sent. - Continue to monitor symptoms. - Recommended home exercises, such as practicing sit-to-stand from a chair. - Instructed to follow up on the existing physical therapy referral if he wishes to pursue formal PT. He can utilize the therapy services at the facility where his wife resides.

## 2024-01-20 NOTE — Patient Instructions (Addendum)
 It was nice to see you today,  We addressed the following topics today: - I am sending a new prescription for Atorvastatin  40mg  to your pharmacy. You can start taking that once you pick it up. If you want to use the 80mg  pills you have at home, you can cut them in half and take one half each day. - For the difficulty getting up, you can practice standing up from a chair and sitting back down 10-20 times a day to help with strength. - I am sending in the [facility].  - Please see me back in six months to check your cholesterol and see how you are doing.  Have a great day,  Rolan Slain, MD

## 2024-01-20 NOTE — Assessment & Plan Note (Signed)
 Patient is a 73 year old man with a history of hyperlipidemia. He has not been taking his prescribed atorvastatin  80mg . Recent labs show an LDL of 75, which is at goal. He has no history of MI. Discussed risks and benefits of statin therapy for primary prevention of heart attack and stroke. While his current LDL is good without medication, a statin is recommended for risk reduction. He is agreeable to restarting at a lower dose. - Will decrease atorvastatin  dose to 40mg  daily. - Sent new prescription for atorvastatin  40mg  to [facility]. - Advised he can cut his remaining 80mg  tablets in half to take as 40mg  until he gets the new prescription, if he wishes. - Plan to recheck cholesterol labs in six months.

## 2024-01-20 NOTE — Progress Notes (Signed)
 Established Patient Office Visit  Subjective   Patient ID: William Ashland., male    DOB: 20-Nov-1950  Age: 73 y.o. MRN: [id number]  Chief Complaint  Patient presents with   Medical Management of Chronic Issues    HPI  Subjective - Reports intermittent difficulty rising from a seated position, particularly from bed in the morning. Describes needing to worm his way up and uses a walker for support. Denies issues with strength, balance, or dizziness. Feels worn out. - Reports issue with a lisinopril  refill. States the pharmacy told him they were waiting for the office, but the prescription was sent two days ago.  Medications Current medication regimen discussed, including lisinopril  and atorvastatin . Was taking atorvastatin  80mg  but has not been taking it recently. Reports having a supply of the 80mg  tablets at home.  PMH, PSH, FH, Social Hx PMHx: Hyperlipidemia. History of elevated liver enzymes pre-surgically, now resolved. No history of heart attack. Social Hx: Reports his wife is in a nursing home and he visits frequently.  ROS MSK: Reports difficulty with transfers, primarily in the morning. Denies generalized weakness. Constitutional: Reports feeling worn out. Neuro: Denies dizziness or imbalance.  The 10-year ASCVD risk score (Arnett DK, et al., 2019) is: 18.7%  Health Maintenance Due  Topic Date Due   DTaP/Tdap/Td (1 - Tdap) Never done   Colonoscopy  Never done   Zoster Vaccines- Shingrix (1 of 2) Never done   Pneumococcal Vaccine: 50+ Years (2 of 2 - PCV20 or PCV21) 09/30/2018   COVID-19 Vaccine (3 - 2025-26 season) 10/11/2023   Medicare Annual Wellness (AWV)  11/10/2023      Objective:     BP 112/67   Pulse 90   Ht 5' 11 (1.803 m)   Wt 173 lb (78.5 kg)   SpO2 96%   BMI 24.13 kg/m    Physical Exam Gen: alert, oriented Pulm: no respiratory distress Msk: able to arise from seating position without difficulty.  Slowed gait.  Psych: pleasant  affect   No results found for any visits on 01/20/24.      Assessment & Plan:   Physical deconditioning Assessment & Plan: Difficulty with transfers / Musculoskeletal deconditioning Reports difficulty rising from a seated position, most prominent in the morning from bed. This is not associated with dizziness or vertigo. Exam is benign. This likely represents some deconditioning. Discussed physical therapy for strengthening. A referral for outpatient PT was previously sent. - Continue to monitor symptoms. - Recommended home exercises, such as practicing sit-to-stand from a chair. - Instructed to follow up on the existing physical therapy referral if he wishes to pursue formal PT. He can utilize the therapy services at the facility where his wife resides.  Orders: -     Ambulatory referral to Physical Therapy  Essential hypertension  Mixed hyperlipidemia Assessment & Plan: Patient is a 73 year old man with a history of hyperlipidemia. He has not been taking his prescribed atorvastatin  80mg . Recent labs show an LDL of 75, which is at goal. He has no history of MI. Discussed risks and benefits of statin therapy for primary prevention of heart attack and stroke. While his current LDL is good without medication, a statin is recommended for risk reduction. He is agreeable to restarting at a lower dose. - Will decrease atorvastatin  dose to 40mg  daily. - Sent new prescription for atorvastatin  40mg  to [facility]. - Advised he can cut his remaining 80mg  tablets in half to take as 40mg  until he gets the new  prescription, if he wishes. - Plan to recheck cholesterol labs in six months.  Orders: -     Atorvastatin  Calcium ; Take 1 tablet (40 mg total) by mouth daily.  Dispense: 90 tablet; Refill: 3     Return in about 6 months (around 07/20/2024) for physical.    Toribio MARLA Slain, MD

## 2024-07-13 ENCOUNTER — Other Ambulatory Visit

## 2024-07-20 ENCOUNTER — Encounter: Admitting: Family Medicine
